# Patient Record
Sex: Female | Born: 1955 | Race: White | Hispanic: No | Marital: Married | Smoking: Current every day smoker
Health system: Southern US, Community
[De-identification: ages and names within clinical notes are randomized; demographics above are authoritative.]

## PROBLEM LIST (undated history)

## (undated) DIAGNOSIS — J449 Chronic obstructive pulmonary disease, unspecified: Secondary | ICD-10-CM

## (undated) DIAGNOSIS — G8929 Other chronic pain: Secondary | ICD-10-CM

## (undated) DIAGNOSIS — F329 Major depressive disorder, single episode, unspecified: Secondary | ICD-10-CM

## (undated) DIAGNOSIS — F419 Anxiety disorder, unspecified: Secondary | ICD-10-CM

## (undated) DIAGNOSIS — F32A Depression, unspecified: Secondary | ICD-10-CM

## (undated) HISTORY — PX: BACK SURGERY: SHX140

## (undated) HISTORY — PX: ABDOMINAL HYSTERECTOMY: SHX81

---

## 2014-08-08 ENCOUNTER — Encounter (HOSPITAL_BASED_OUTPATIENT_CLINIC_OR_DEPARTMENT_OTHER): Payer: Self-pay | Admitting: Emergency Medicine

## 2014-08-08 ENCOUNTER — Inpatient Hospital Stay (HOSPITAL_BASED_OUTPATIENT_CLINIC_OR_DEPARTMENT_OTHER)
Admission: EM | Admit: 2014-08-08 | Discharge: 2014-08-15 | DRG: 871 | Disposition: A | Discharge status: Home / Self Care | Payer: Federal, State, Local not specified - PPO | Attending: Pulmonary Disease | Admitting: Pulmonary Disease

## 2014-08-08 ENCOUNTER — Emergency Department (HOSPITAL_BASED_OUTPATIENT_CLINIC_OR_DEPARTMENT_OTHER): Payer: Federal, State, Local not specified - PPO

## 2014-08-08 DIAGNOSIS — G8929 Other chronic pain: Secondary | ICD-10-CM | POA: Diagnosis present

## 2014-08-08 DIAGNOSIS — R0602 Shortness of breath: Secondary | ICD-10-CM | POA: Diagnosis present

## 2014-08-08 DIAGNOSIS — E871 Hypo-osmolality and hyponatremia: Secondary | ICD-10-CM | POA: Diagnosis present

## 2014-08-08 DIAGNOSIS — E860 Dehydration: Secondary | ICD-10-CM | POA: Diagnosis present

## 2014-08-08 DIAGNOSIS — R6521 Severe sepsis with septic shock: Secondary | ICD-10-CM | POA: Diagnosis present

## 2014-08-08 DIAGNOSIS — J449 Chronic obstructive pulmonary disease, unspecified: Secondary | ICD-10-CM | POA: Diagnosis present

## 2014-08-08 DIAGNOSIS — J441 Chronic obstructive pulmonary disease with (acute) exacerbation: Secondary | ICD-10-CM | POA: Diagnosis present

## 2014-08-08 DIAGNOSIS — J90 Pleural effusion, not elsewhere classified: Secondary | ICD-10-CM | POA: Diagnosis present

## 2014-08-08 DIAGNOSIS — F329 Major depressive disorder, single episode, unspecified: Secondary | ICD-10-CM | POA: Diagnosis present

## 2014-08-08 DIAGNOSIS — F29 Unspecified psychosis not due to a substance or known physiological condition: Secondary | ICD-10-CM | POA: Diagnosis present

## 2014-08-08 DIAGNOSIS — J189 Pneumonia, unspecified organism: Secondary | ICD-10-CM | POA: Diagnosis present

## 2014-08-08 DIAGNOSIS — F17210 Nicotine dependence, cigarettes, uncomplicated: Secondary | ICD-10-CM | POA: Diagnosis present

## 2014-08-08 DIAGNOSIS — F513 Sleepwalking [somnambulism]: Secondary | ICD-10-CM | POA: Diagnosis present

## 2014-08-08 DIAGNOSIS — Z9981 Dependence on supplemental oxygen: Secondary | ICD-10-CM | POA: Diagnosis not present

## 2014-08-08 DIAGNOSIS — R55 Syncope and collapse: Secondary | ICD-10-CM | POA: Diagnosis present

## 2014-08-08 DIAGNOSIS — A419 Sepsis, unspecified organism: Secondary | ICD-10-CM | POA: Diagnosis present

## 2014-08-08 DIAGNOSIS — E876 Hypokalemia: Secondary | ICD-10-CM | POA: Diagnosis present

## 2014-08-08 DIAGNOSIS — Z91040 Latex allergy status: Secondary | ICD-10-CM

## 2014-08-08 DIAGNOSIS — J9601 Acute respiratory failure with hypoxia: Secondary | ICD-10-CM | POA: Diagnosis present

## 2014-08-08 DIAGNOSIS — F410 Panic disorder [episodic paroxysmal anxiety] without agoraphobia: Secondary | ICD-10-CM | POA: Diagnosis present

## 2014-08-08 DIAGNOSIS — J810 Acute pulmonary edema: Secondary | ICD-10-CM | POA: Diagnosis not present

## 2014-08-08 DIAGNOSIS — Z9289 Personal history of other medical treatment: Secondary | ICD-10-CM

## 2014-08-08 DIAGNOSIS — J432 Centrilobular emphysema: Secondary | ICD-10-CM | POA: Diagnosis not present

## 2014-08-08 DIAGNOSIS — F419 Anxiety disorder, unspecified: Secondary | ICD-10-CM | POA: Diagnosis present

## 2014-08-08 DIAGNOSIS — J439 Emphysema, unspecified: Secondary | ICD-10-CM

## 2014-08-08 HISTORY — DX: Chronic obstructive pulmonary disease, unspecified: J44.9

## 2014-08-08 HISTORY — DX: Depression, unspecified: F32.A

## 2014-08-08 HISTORY — DX: Other chronic pain: G89.29

## 2014-08-08 HISTORY — DX: Anxiety disorder, unspecified: F41.9

## 2014-08-08 HISTORY — DX: Major depressive disorder, single episode, unspecified: F32.9

## 2014-08-08 LAB — COMPREHENSIVE METABOLIC PANEL
ALBUMIN: 2.6 g/dL — AB (ref 3.5–5.2)
ALT: 17 U/L (ref 0–35)
ALT: 16 U/L (ref 0–35)
AST: 32 U/L (ref 0–37)
AST: 32 U/L (ref 0–37)
Albumin: 3.3 g/dL — ABNORMAL LOW (ref 3.5–5.2)
Alkaline Phosphatase: 47 U/L (ref 39–117)
Alkaline Phosphatase: 40 U/L (ref 39–117)
Anion gap: 5 (ref 5–15)
Anion gap: 7 (ref 5–15)
BILIRUBIN TOTAL: 0.9 mg/dL (ref 0.3–1.2)
BUN: 13 mg/dL (ref 6–23)
BUN: 10 mg/dL (ref 6–23)
CALCIUM: 8.1 mg/dL — AB (ref 8.4–10.5)
CHLORIDE: 95 mmol/L — AB (ref 96–112)
CO2: 24 mmol/L (ref 19–32)
CO2: 22 mmol/L (ref 19–32)
CREATININE: 1 mg/dL (ref 0.50–1.10)
Calcium: 7.6 mg/dL — ABNORMAL LOW (ref 8.4–10.5)
Chloride: 101 mmol/L (ref 96–112)
Creatinine, Ser: 0.85 mg/dL (ref 0.50–1.10)
GFR calc Af Amer: 72 mL/min — ABNORMAL LOW (ref >90)
GFR calc Af Amer: 87 mL/min — ABNORMAL LOW (ref >90)
GFR calc non Af Amer: 62 mL/min — ABNORMAL LOW (ref >90)
GFR calc non Af Amer: 75 mL/min — ABNORMAL LOW (ref >90)
Glucose, Bld: 102 mg/dL — ABNORMAL HIGH (ref 70–99)
Glucose, Bld: 117 mg/dL — ABNORMAL HIGH (ref 70–99)
Potassium: 3.1 mmol/L — ABNORMAL LOW (ref 3.5–5.1)
Potassium: 3 mmol/L — ABNORMAL LOW (ref 3.5–5.1)
SODIUM: 130 mmol/L — AB (ref 135–145)
Sodium: 124 mmol/L — ABNORMAL LOW (ref 135–145)
Total Bilirubin: 1 mg/dL (ref 0.3–1.2)
Total Protein: 6.5 g/dL (ref 6.0–8.3)
Total Protein: 5.5 g/dL — ABNORMAL LOW (ref 6.0–8.3)

## 2014-08-08 LAB — URINALYSIS, ROUTINE W REFLEX MICROSCOPIC
BILIRUBIN URINE: NEGATIVE
GLUCOSE, UA: NEGATIVE mg/dL
HGB URINE DIPSTICK: NEGATIVE
KETONES UR: NEGATIVE mg/dL
LEUKOCYTES UA: NEGATIVE
Nitrite: NEGATIVE
PH: 6 (ref 5.0–8.0)
PROTEIN: NEGATIVE mg/dL
Specific Gravity, Urine: 1.027 (ref 1.005–1.030)
Urobilinogen, UA: 0.2 mg/dL (ref 0.0–1.0)

## 2014-08-08 LAB — TROPONIN I

## 2014-08-08 LAB — CBC
HCT: 39.4 % (ref 36.0–46.0)
Hemoglobin: 13.7 g/dL (ref 12.0–15.0)
MCH: 32.9 pg (ref 26.0–34.0)
MCHC: 34.8 g/dL (ref 30.0–36.0)
MCV: 94.5 fL (ref 78.0–100.0)
Platelets: 221 10*3/uL (ref 150–400)
RBC: 4.17 MIL/uL (ref 3.87–5.11)
RDW: 13.9 % (ref 11.5–15.5)
WBC: 10.9 10*3/uL — ABNORMAL HIGH (ref 4.0–10.5)

## 2014-08-08 LAB — CBC WITH DIFFERENTIAL/PLATELET
BASOS ABS: 0 10*3/uL (ref 0.0–0.1)
BASOS PCT: 0 % (ref 0–1)
Eosinophils Absolute: 0 10*3/uL (ref 0.0–0.7)
Eosinophils Relative: 0 % (ref 0–5)
HCT: 41.2 % (ref 36.0–46.0)
Hemoglobin: 14.7 g/dL (ref 12.0–15.0)
LYMPHS PCT: 9 % — AB (ref 12–46)
Lymphs Abs: 1 10*3/uL (ref 0.7–4.0)
MCH: 33.2 pg (ref 26.0–34.0)
MCHC: 35.7 g/dL (ref 30.0–36.0)
MCV: 93 fL (ref 78.0–100.0)
Monocytes Absolute: 0.3 10*3/uL (ref 0.1–1.0)
Monocytes Relative: 2 % — ABNORMAL LOW (ref 3–12)
NEUTROS ABS: 9.7 10*3/uL — AB (ref 1.7–7.7)
NEUTROS PCT: 89 % — AB (ref 43–77)
PLATELETS: 265 10*3/uL (ref 150–400)
RBC: 4.43 MIL/uL (ref 3.87–5.11)
RDW: 13.6 % (ref 11.5–15.5)
WBC: 11 10*3/uL — AB (ref 4.0–10.5)

## 2014-08-08 LAB — I-STAT VENOUS BLOOD GAS, ED
Acid-base deficit: 7 mmol/L — ABNORMAL HIGH (ref 0.0–2.0)
Bicarbonate: 16.4 mEq/L — ABNORMAL LOW (ref 20.0–24.0)
O2 Saturation: 91 %
PH VEN: 7.383 — AB (ref 7.250–7.300)
Patient temperature: 98
TCO2: 17 mmol/L (ref 0–100)
pCO2, Ven: 27.4 mmHg — ABNORMAL LOW (ref 45.0–50.0)
pO2, Ven: 60 mmHg — ABNORMAL HIGH (ref 30.0–45.0)

## 2014-08-08 LAB — GLUCOSE, CAPILLARY: GLUCOSE-CAPILLARY: 104 mg/dL — AB (ref 70–99)

## 2014-08-08 LAB — PROTIME-INR
INR: 1.26 (ref 0.00–1.49)
Prothrombin Time: 15.8 seconds — ABNORMAL HIGH (ref 11.6–15.2)

## 2014-08-08 LAB — LACTIC ACID, PLASMA
LACTIC ACID, VENOUS: 2.5 mmol/L — AB (ref 0.5–2.0)
Lactic Acid, Venous: 2.7 mmol/L (ref 0.5–2.0)

## 2014-08-08 LAB — MAGNESIUM: MAGNESIUM: 1.3 mg/dL — AB (ref 1.5–2.5)

## 2014-08-08 LAB — MRSA PCR SCREENING: MRSA BY PCR: NEGATIVE

## 2014-08-08 LAB — PROCALCITONIN: Procalcitonin: 7.55 ng/mL

## 2014-08-08 LAB — I-STAT CG4 LACTIC ACID, ED: LACTIC ACID, VENOUS: 2.89 mmol/L — AB (ref 0.5–2.0)

## 2014-08-08 LAB — PHOSPHORUS: Phosphorus: 2.2 mg/dL — ABNORMAL LOW (ref 2.3–4.6)

## 2014-08-08 MED ORDER — CEFTRIAXONE SODIUM IN DEXTROSE 20 MG/ML IV SOLN
1.0000 g | INTRAVENOUS | Status: DC
Start: 2014-08-08 — End: 2014-08-08
  Administered 2014-08-08: 1 g via INTRAVENOUS
  Filled 2014-08-08: qty 50

## 2014-08-08 MED ORDER — SODIUM CHLORIDE 0.9 % IV SOLN
Freq: Once | INTRAVENOUS | Status: AC
  Administered 2014-08-08: 15:00:00 via INTRAVENOUS

## 2014-08-08 MED ORDER — ROCURONIUM BROMIDE 50 MG/5ML IV SOLN
INTRAVENOUS | Status: AC
  Filled 2014-08-08: qty 2

## 2014-08-08 MED ORDER — SODIUM CHLORIDE 0.9 % IV BOLUS (SEPSIS): 1000.0000 mL | Freq: Once | INTRAVENOUS | Status: DC

## 2014-08-08 MED ORDER — SODIUM CHLORIDE 0.9 % IV SOLN: 250.0000 mL | INTRAVENOUS | Status: DC | PRN

## 2014-08-08 MED ORDER — CETYLPYRIDINIUM CHLORIDE 0.05 % MT LIQD
7.0000 mL | Freq: Two times a day (BID) | OROMUCOSAL | Status: DC
  Administered 2014-08-09 – 2014-08-15 (×13): 7 mL via OROMUCOSAL

## 2014-08-08 MED ORDER — SODIUM CHLORIDE 0.9 % IV SOLN
INTRAVENOUS | Status: DC
Start: 2014-08-08 — End: 2014-08-12
  Administered 2014-08-08 – 2014-08-12 (×3): via INTRAVENOUS

## 2014-08-08 MED ORDER — VANCOMYCIN HCL IN DEXTROSE 1-5 GM/200ML-% IV SOLN
1000.0000 mg | Freq: Once | INTRAVENOUS | Status: AC
Start: 2014-08-08 — End: 2014-08-08
  Administered 2014-08-08: 1000 mg via INTRAVENOUS
  Filled 2014-08-08: qty 200

## 2014-08-08 MED ORDER — CHLORHEXIDINE GLUCONATE 0.12 % MT SOLN
15.0000 mL | Freq: Two times a day (BID) | OROMUCOSAL | Status: DC
  Administered 2014-08-08 – 2014-08-15 (×14): 15 mL via OROMUCOSAL
  Filled 2014-08-08 (×15): qty 15

## 2014-08-08 MED ORDER — POTASSIUM CHLORIDE 10 MEQ/100ML IV SOLN
10.0000 meq | INTRAVENOUS | Status: AC
  Administered 2014-08-08 (×4): 10 meq via INTRAVENOUS
  Filled 2014-08-08: qty 100

## 2014-08-08 MED ORDER — BUDESONIDE 0.25 MG/2ML IN SUSP
0.2500 mg | Freq: Two times a day (BID) | RESPIRATORY_TRACT | Status: DC
  Administered 2014-08-08 – 2014-08-15 (×14): 0.25 mg via RESPIRATORY_TRACT
  Filled 2014-08-08 (×17): qty 2

## 2014-08-08 MED ORDER — IPRATROPIUM-ALBUTEROL 0.5-2.5 (3) MG/3ML IN SOLN
3.0000 mL | RESPIRATORY_TRACT | Status: DC
  Administered 2014-08-08: 3 mL via RESPIRATORY_TRACT
  Filled 2014-08-08: qty 3

## 2014-08-08 MED ORDER — SUCCINYLCHOLINE CHLORIDE 20 MG/ML IJ SOLN
INTRAMUSCULAR | Status: AC
  Filled 2014-08-08: qty 1

## 2014-08-08 MED ORDER — LIDOCAINE HCL (CARDIAC) 20 MG/ML IV SOLN
INTRAVENOUS | Status: AC
  Filled 2014-08-08: qty 5

## 2014-08-08 MED ORDER — AZITHROMYCIN 500 MG IV SOLR
500.0000 mg | INTRAVENOUS | Status: DC
  Administered 2014-08-08 – 2014-08-09 (×2): 500 mg via INTRAVENOUS
  Filled 2014-08-08 (×3): qty 500

## 2014-08-08 MED ORDER — IOHEXOL 350 MG/ML SOLN
100.0000 mL | Freq: Once | INTRAVENOUS | Status: AC | PRN
  Administered 2014-08-08: 100 mL via INTRAVENOUS

## 2014-08-08 MED ORDER — SODIUM CHLORIDE 0.9 % IV BOLUS (SEPSIS)
2000.0000 mL | Freq: Once | INTRAVENOUS | Status: AC
Start: 2014-08-08 — End: 2014-08-08
  Administered 2014-08-08: 2000 mL via INTRAVENOUS

## 2014-08-08 MED ORDER — NOREPINEPHRINE BITARTRATE 1 MG/ML IV SOLN
2.0000 ug/min | INTRAVENOUS | Status: DC
  Administered 2014-08-08 – 2014-08-09 (×2): 2 ug/min via INTRAVENOUS
  Filled 2014-08-08 (×2): qty 4

## 2014-08-08 MED ORDER — PANTOPRAZOLE SODIUM 40 MG IV SOLR
40.0000 mg | Freq: Every day | INTRAVENOUS | Status: DC
  Administered 2014-08-08: 40 mg via INTRAVENOUS
  Filled 2014-08-08 (×2): qty 40

## 2014-08-08 MED ORDER — CEFTRIAXONE SODIUM IN DEXTROSE 20 MG/ML IV SOLN
1.0000 g | Freq: Once | INTRAVENOUS | Status: AC
  Administered 2014-08-08: 1 g via INTRAVENOUS
  Filled 2014-08-08: qty 50

## 2014-08-08 MED ORDER — ALBUTEROL SULFATE (2.5 MG/3ML) 0.083% IN NEBU: 2.5000 mg | INHALATION_SOLUTION | RESPIRATORY_TRACT | Status: DC | PRN

## 2014-08-08 MED ORDER — CEFTRIAXONE SODIUM IN DEXTROSE 40 MG/ML IV SOLN
2.0000 g | INTRAVENOUS | Status: DC
  Administered 2014-08-09 – 2014-08-14 (×6): 2 g via INTRAVENOUS
  Filled 2014-08-08 (×7): qty 50

## 2014-08-08 MED ORDER — FENTANYL CITRATE 0.05 MG/ML IJ SOLN
12.5000 ug | INTRAMUSCULAR | Status: DC | PRN
  Administered 2014-08-08 – 2014-08-12 (×13): 25 ug via INTRAVENOUS
  Administered 2014-08-12: 12.5 ug via INTRAVENOUS
  Administered 2014-08-13 – 2014-08-14 (×2): 25 ug via INTRAVENOUS
  Filled 2014-08-08 (×16): qty 2

## 2014-08-08 MED ORDER — ARFORMOTEROL TARTRATE 15 MCG/2ML IN NEBU
15.0000 ug | INHALATION_SOLUTION | Freq: Two times a day (BID) | RESPIRATORY_TRACT | Status: DC
  Administered 2014-08-08 – 2014-08-14 (×13): 15 ug via RESPIRATORY_TRACT
  Filled 2014-08-08 (×17): qty 2

## 2014-08-08 MED ORDER — SODIUM CHLORIDE 0.9 % IV BOLUS (SEPSIS)
1000.0000 mL | Freq: Once | INTRAVENOUS | Status: AC
Start: 2014-08-08 — End: 2014-08-08
  Administered 2014-08-08: 1000 mL via INTRAVENOUS

## 2014-08-08 MED ORDER — HEPARIN SODIUM (PORCINE) 5000 UNIT/ML IJ SOLN
5000.0000 [IU] | Freq: Three times a day (TID) | INTRAMUSCULAR | Status: DC
  Administered 2014-08-08 – 2014-08-14 (×18): 5000 [IU] via SUBCUTANEOUS
  Filled 2014-08-08 (×24): qty 1

## 2014-08-08 MED ORDER — PIPERACILLIN-TAZOBACTAM 3.375 G IVPB 30 MIN
3.3750 g | Freq: Once | INTRAVENOUS | Status: AC
  Administered 2014-08-08: 3.375 g via INTRAVENOUS
  Filled 2014-08-08 (×2): qty 50

## 2014-08-08 MED ORDER — PNEUMOCOCCAL VAC POLYVALENT 25 MCG/0.5ML IJ INJ
0.5000 mL | INJECTION | INTRAMUSCULAR | Status: AC
  Administered 2014-08-09: 0.5 mL via INTRAMUSCULAR
  Filled 2014-08-08: qty 0.5

## 2014-08-08 MED ORDER — ETOMIDATE 2 MG/ML IV SOLN
INTRAVENOUS | Status: AC
  Filled 2014-08-08: qty 20

## 2014-08-08 NOTE — ED Provider Notes (Signed)
CSN: [id number]     Arrival date & time 08/08/14  1302 History   First MD Initiated Contact with Patient 08/08/14 1336     Chief Complaint  Patient presents with  . Shortness of Breath     (Consider location/radiation/quality/duration/timing/severity/associated sxs/prior Treatment) The history is provided by the patient.  Mieke Brinley is a 59 y.o. female hx of COPD  Here presenting with shortness of breath, syncope.  She has been having productive cough for several days.  Also had 2 episodes diarrhea and occasional vomiting.  She was supposed to go for a walk today with her family and walk a short distance and then passed out.  She states that she has some worsening shortness of breath prior to it.  Patient was noted to be hypoxic to 82% on room air  And hypotensive 80s on arrival with tachycardia 114.  She denies any history of PE.    Past Medical History  Diagnosis Date  . COPD (chronic obstructive pulmonary disease)    Past Surgical History  Procedure Laterality Date  . Back surgery    . Abdominal hysterectomy     No family history on file. History  Substance Use Topics  . Smoking status: Current Every Day Smoker  . Smokeless tobacco: Not on file  . Alcohol Use: No   OB History    No data available     Review of Systems  Respiratory: Positive for shortness of breath.   All other systems reviewed and are negative.     Allergies  Contrast media  Home Medications   Prior to Admission medications   Not on File   BP 101/44 mmHg  Pulse 119  Temp(Src) 98.2 F (36.8 C) (Oral)  Resp 23  Ht  (1.626 m)  Wt 160 lb (72.576 kg)  BMI 27.45 kg/m2  SpO2 100% Physical Exam  Constitutional: She is oriented to person, place, and time.  Ill appearing   HENT:  Head: Normocephalic.  MM dry   Eyes: Conjunctivae are normal. Pupils are equal, round, and reactive to light.  Neck: Normal range of motion. Neck supple.  Cardiovascular: Regular rhythm and normal heart sounds.    Tachycardic   Pulmonary/Chest:  Crackles bilateral bases   Abdominal: Soft. Bowel sounds are normal. She exhibits no distension. There is no tenderness. There is no rebound.  Musculoskeletal: Normal range of motion. She exhibits no edema or tenderness.  Neurological: She is alert and oriented to person, place, and time. No cranial nerve deficit. Coordination normal.  Skin: Skin is warm and dry.  Psychiatric: She has a normal mood and affect. Her behavior is normal. Judgment and thought content normal.  Nursing note and vitals reviewed.   ED Course  Procedures (including critical care time)  CRITICAL CARE Performed by: Silverio Lay, Simi Briel   Total critical care time: 30 min   Critical care time was exclusive of separately billable procedures and treating other patients.  Critical care was necessary to treat or prevent imminent or life-threatening deterioration.  Critical care was time spent personally by me on the following activities: development of treatment plan with patient and/or surrogate as well as nursing, discussions with consultants, evaluation of patient's response to treatment, examination of patient, obtaining history from patient or surrogate, ordering and performing treatments and interventions, ordering and review of laboratory studies, ordering and review of radiographic studies, pulse oximetry and re-evaluation of patient's condition.   Angiocath insertion Performed by: Chaney Malling  Consent: Verbal consent obtained. Risks and  benefits: risks, benefits and alternatives were discussed Time out: Immediately prior to procedure a "time out" was called to verify the correct patient, procedure, equipment, support staff and site/side marked as required.  Preparation: Patient was prepped and draped in the usual sterile fashion.  Vein Location: L brachial  Ultrasound Guided  Gauge: 20 long  Normal blood return and flush without difficulty Patient tolerance: Patient tolerated  the procedure well with no immediate complications.     Labs Review Labs Reviewed  CBC WITH DIFFERENTIAL/PLATELET - Abnormal; Notable for the following:    WBC 11.0 (*)    Neutrophils Relative % 89 (*)    Neutro Abs 9.7 (*)    Lymphocytes Relative 9 (*)    Monocytes Relative 2 (*)    All other components within normal limits  I-STAT CG4 LACTIC ACID, ED - Abnormal; Notable for the following:    Lactic Acid, Venous 2.89 (*)    All other components within normal limits  CULTURE, BLOOD (ROUTINE X 2)  CULTURE, BLOOD (ROUTINE X 2)  COMPREHENSIVE METABOLIC PANEL  TROPONIN I  PROTIME-INR    Imaging Review Ct Angio Chest Pe W/cm &/or Wo Cm  08/08/2014   CLINICAL DATA:  Patient passed out today. Syncopal episode today. Short of breath. Hypotension. History of COPD.  EXAM: CT ANGIOGRAPHY CHEST WITH CONTRAST  TECHNIQUE: Multidetector CT imaging of the chest was performed using the standard protocol during bolus administration of intravenous contrast. Multiplanar CT image reconstructions and MIPs were obtained to evaluate the vascular anatomy.  CONTRAST:  OMNIPAQUE IOHEXOL 350 MG/ML SOLN  COMPARISON:  None.  FINDINGS: Bones: Thoracic vertebral body height is preserved. No aggressive osseous lesions. No displaced rib fractures.  Cardiovascular: Technically adequate study for evaluation of pulmonary embolus. Negative for pulmonary embolism. Coronary artery atherosclerosis is present. If office based assessment of coronary risk factors has not been performed, it is now recommended. Motion artifact is present, mildly degrading the exam. Aortic arch atherosclerosis.  Lungs: There is consolidation of most of the LEFT lower lobe with a dependent distribution. A small area of consolidation in the RIGHT lower lobe is also present. There is underlying emphysema. Upper lobe faint interstitial and airspace opacity.  Central airways: Patent.  No bronchial air-fluid levels.  Effusions: None.  Lymphadenopathy: No  axillary adenopathy. Small peritracheal an AP window lymph nodes are probably reactive.  Esophagus: Patulous.  Small hiatal hernia.  Upper abdomen: Within normal limits.  Other: None.  Review of the MIP images confirms the above findings.  IMPRESSION: 1. Negative for pulmonary embolism or acute aortic abnormality. 2. Atherosclerosis and coronary artery disease. 3. Consolidation of most of the LEFT lower lobe with smaller area of consolidation in the dependent RIGHT lower lobe. The pattern is most compatible with aspiration. 4. Emphysema, with interstitial and airspace opacity in the upper lobes. This may represent aspiration or superimposed pulmonary edema.   Electronically Signed   By: Andreas Newport M.D.   On: 08/08/2014 14:22     EKG Interpretation   Date/Time:  Tuesday August 08 2014 13:34:02 EST Ventricular Rate:  117 PR Interval:  138 QRS Duration: 90 QT Interval:  314 QTC Calculation: 438 R Axis:   26 Text Interpretation:  Sinus tachycardia Nonspecific ST abnormality  Abnormal ECG No previous ECGs available Confirmed by Ova Meegan  MD, Melina Mosteller  ([id number]) on 08/08/2014 1:35:52 PM      MDM   Final diagnoses:  Shortness of breath   Patriciann Becht is a 59 y.o. female  here with shortness of breath, hypotension, hypoxia. Concerned for massive PE. Will get CT angio to r/o PE. Will get labs.   3 PM CT showed no PE but has multi focal pneumonia. After 3 L NS, BP still in mid 90s. Given vanc/zosyn. I started her on levophed drip. She is 100% on nonrebreather and mentating well so will not intubate currently. Discussed with Dr. Darrol AngelSimons from critical care and will transfer.     Richardean Canalavid H Deniro Laymon, MD 08/08/14 [phone]

## 2014-08-08 NOTE — ED Notes (Signed)
Attempted to wean patient to 40% VM, did not tolerate. Placed back on PRB. RT will continue to monitor.

## 2014-08-08 NOTE — ED Notes (Signed)
Report called to Paula with [facility]. 

## 2014-08-08 NOTE — Progress Notes (Signed)
Pt arrived on the unit and placed on ICU equipment.  Pt on 100% nonrebreather sat 88-92%.  Pt with labored breathing and a non-productive cough.  Pt on levophed gtt at 8915mcg/mn.  Provider at bedside.  See full assessment.

## 2014-08-08 NOTE — ED Notes (Signed)
Productive cough with SOB. Passed out at hotel today. Pt feels weak and 02sats are low at this time on room air.

## 2014-08-08 NOTE — Progress Notes (Addendum)
ANTIBIOTIC CONSULT NOTE - INITIAL  Pharmacy Consult for ceftriaxone and azithromycin Indication: CAP  Allergies  Allergen Reactions  . Contrast Media [Iodinated Diagnostic Agents] Nausea And Vomiting    Patient Measurements: Height: 5\' 4"  (162.6 cm) Weight: 160 lb (72.576 kg) IBW/kg (Calculated) : 54.7   Vital Signs: Temp: 98 F (36.7 C) (03/08 1655) Temp Source: Oral (03/08 1320) BP: 106/47 mmHg (03/08 1715) Pulse Rate: 116 (03/08 1715) Intake/Output from previous day:   Intake/Output from this shift: Total I/O In: -  Out: 800 [Urine:800]  Labs:  Recent Labs  08/08/14 1400  WBC 11.0*  HGB 14.7  PLT 265  CREATININE 1.00   Estimated Creatinine Clearance: 61.4 mL/min (by C-G formula based on Cr of 1). No results for input(s): VANCOTROUGH, VANCOPEAK, VANCORANDOM, GENTTROUGH, GENTPEAK, GENTRANDOM, TOBRATROUGH, TOBRAPEAK, TOBRARND, AMIKACINPEAK, AMIKACINTROU, AMIKACIN in the last 72 hours.   Microbiology: No results found for this or any previous visit (from the past 720 hour(s)).  Medical History: Past Medical History  Diagnosis Date  . COPD (chronic obstructive pulmonary disease)     Assessment: [age] YOF admitted with SOB with history of COPD and productive cough for several days. Had a syncopal episode prior to arrival. CT negative for PE, but did show multifocal pneumonia. She was given a 1 time dose of Zosyn 3.375g and 1 dose of vancomycin of 1000mg  in the ED. To start ceftriaxone and azithromycin for CAP. SCr 1, est CrCl ~60-8165mL/min  Goal of Therapy:  eradication of infection  Plan:  -ceftriaxone 1g IV q24h -azithromycin 500mg  IV q24h -pharmacy will sign off from these consults as no renal adjustment is required. Standard CCM consult in place, so we will follow for clinical progression, c/s, LOT  Martiza Speth D. Mersadies Petree, PharmD, BCPS Clinical Pharmacist Pager: [phone] 08/08/2014 7:05 PM

## 2014-08-08 NOTE — ED Notes (Signed)
[facility] here at this time 

## 2014-08-08 NOTE — ED Notes (Signed)
Pt moved to room 14 at this time. When pt came back to room 1, pt had oxygen sats at 87, BP 76/32. IVF bolus started

## 2014-08-08 NOTE — ED Notes (Signed)
Called report to L'Tya,RN

## 2014-08-08 NOTE — H&P (Signed)
PULMONARY / CRITICAL CARE MEDICINE   Name: Destiny Chapman MRN: [id number] DOB: 1955/07/29    ADMISSION DATE:  08/08/2014 CONSULTATION DATE:  3/8  REFERRING MD :  Silverio Lay   CHIEF COMPLAINT:  Acute hypoxic respiratory failure   INITIAL PRESENTATION:  59 year old female admitted from [facility] on 3/8 w/ working dx of sepsis and acute hypoxic respiratory failure in the setting of LLL CAP.    STUDIES:  CT chest 3/8: 1. Negative for pulmonary embolism. Consolidation of most of the LEFT lower lobe with smaller area of consolidation in the dependent RIGHT lower lobe. Emphysema, with interstitial and airspace opacity in the upper lobes. This may represent aspiration or superimposed pulmonary edema.  SIGNIFICANT EVENTS:   HISTORY OF PRESENT ILLNESS:   59 y.o. Female, current 1.5 ppd smoker,  hx of COPD Here presented to [facility] on 3/8 w/ several day h/o productive cough and progressive shortness of breath. Syncope.Also had 2 episodes diarrhea and occasional vomiting.She had planned to go for a walk with her family on the day of admit. During the walk she became progressively more SOB.  She and walked a short distance and then passed out. She presented to [facility] w/ these symptoms. Patient was noted to be hypoxic to 82% on room air And hypotensive 80s on arrival with tachycardia 114. She denied any history of PE.  CT chest showed marked LLL consolidation. She was placed on oxygen, IVFs started and ABX administered. She was transferred to [facility] for admission   PAST MEDICAL HISTORY :   has a past medical history of COPD (chronic obstructive pulmonary disease).  has past surgical history that includes Back surgery and Abdominal hysterectomy. Prior to Admission medications   Not on File   Allergies  Allergen Reactions  . Contrast Media [Iodinated Diagnostic Agents] Nausea And Vomiting    FAMILY HISTORY:  has no family status information on file.  SOCIAL HISTORY:  reports that she has been smoking.  She  does not have any smokeless tobacco history on file. She reports that she does not drink alcohol.  REVIEW OF SYSTEMS:   Bolds are positive  Constitutional: weight loss, gain, night sweats, Fevers, chills, fatigue .  HEENT: headaches, Sore throat, sneezing, nasal congestion, post nasal drip, Difficulty swallowing, Tooth/dental problems, visual complaints visual changes, ear ache CV:  chest pain, radiates: ,Orthopnea, PND, swelling in lower extremities, dizziness, palpitations, syncope.  GI  heartburn, indigestion, abdominal pain, nausea, vomiting, diarrhea, change in bowel habits, loss of appetite, bloody stools.  Resp: cough, non-productive: , hemoptysis, dyspnea, chest pain, pleuritic.  Skin: rash or itching or icterus GU: dysuria, change in color of urine, urgency or frequency. flank pain, hematuria  MS: joint pain or swelling. decreased range of motion  Psych: change in mood or affect. depression or anxiety.  Neuro: difficulty with speech, weakness, numbness, ataxia    SUBJECTIVE:   VITAL SIGNS: Temp:  [98.2 F (36.8 C)] 98.2 F (36.8 C) (03/08 1320) Pulse Rate:  [114-121] 116 (03/08 1520) Resp:  [18-34] 25 (03/08 1520) BP: (83-106)/(37-49) 106/49 mmHg (03/08 1520) SpO2:  [82 %-100 %] 100 % (03/08 1520) Weight:  [72.576 kg (160 lb)] 72.576 kg (160 lb) (03/08 1320) HEMODYNAMICS:   VENTILATOR SETTINGS:   INTAKE / OUTPUT: No intake or output data in the 24 hours ending 08/08/14 1531  PHYSICAL EXAMINATION: General:  Chronically ill appearing female in mild distress Neuro:  Alert, oriented, non-focal HEENT:  [place]/AT, PERRL, no JVD noted Cardiovascular:  RRR, no MRG Lungs:  Bilateral rhonchi in bases L>R. Overall diminished. Abdomen:  Soft, non-tender, non-distended Musculoskeletal:  No acute deformity or ROM limitation Skin:  Grossly intact.  LABS:  CBC  Recent Labs Lab 08/08/14 1400  WBC 11.0*  HGB 14.7  HCT 41.2  PLT 265   Coag's  Recent Labs Lab  08/08/14 1400  INR 1.26   BMET  Recent Labs Lab 08/08/14 1400  NA 124*  K 3.1*  CL 95*  CO2 24  BUN 13  CREATININE 1.00  GLUCOSE 102*   Electrolytes  Recent Labs Lab 08/08/14 1400  CALCIUM 8.1*   Sepsis Markers  Recent Labs Lab 08/08/14 1445  LATICACIDVEN 2.89*   ABG No results for input(s): PHART, PCO2ART, PO2ART in the last 168 hours. Liver Enzymes  Recent Labs Lab 08/08/14 1400  AST 32  ALT 17  ALKPHOS 47  BILITOT 0.9  ALBUMIN 3.3*   Cardiac Enzymes  Recent Labs Lab 08/08/14 1400  TROPONINI <0.03   Glucose No results for input(s): GLUCAP in the last 168 hours.  Imaging No results found.   ASSESSMENT / PLAN:  PULMONARY OETT A: Acute hypoxic respiratory failure  LLL consolidative airspace disease c/w PNA w/ bilateral pulmonary infiltrates  H/o COPD  P:   Supplemental oxygen to keep SPo2 greater than 92% Pulm hygiene  Scheduled brovana/budesonide  May require intubation if any decline See ID section   CARDIOVASCULAR CVL A:  Sepsis/septic shock Syncopal episode  P:  Ensure 30 ml/kg fluid challenge  Continue norepinephrine to maintain MAP > 65 mm/Hg Attempt to wean norepi, if unable, place CVL > CVP monitoring goal 8-12. Repeat lactic acid Cycle troponin  RENAL A:   Hyponatremia - suspect this resolved with volume resusctiation Hypokalemia   P:   STAT CMP Replace K Free water restrict NS @ 50/hr until chem results  GASTROINTESTINAL A:   Nausea and vomiting   P:   NPO given risk for intubation  PPI for SUP  HEMATOLOGIC A:   No acute issues  P:  Heparin for DVT prophylaxis  Trend CBC Transfuse per ICU protocol   INFECTIOUS A:   CAP  P:   BCx2 3/8>>> Sputum 3/8>>> Urine strep antigen 3/8>> Urine legionella antigen 3/8>> Respiratory viral panel 3/8 >>> Rocephin 3/8>>> Azithro 3/8>>>  ENDOCRINE A:   No acute issues P:   Trend glucose   NEUROLOGIC A:   No acute issues P:   RASS goal:  0 Monitor   FAMILY  - Updates: Updated patient at bedside 3/8  - Inter-disciplinary family meet or Palliative Care meeting due by:  08/15/2014   Joneen RoachPaul Hoffman, AGACNP-BC [place] [facility] Pager [phone] or [phone]  Attending:  I have seen and examined the patient with nurse practitioner/resident and agree with the note above.   Ms. Destiny Chapman has severe community acquired pneumonia with septic shock  She is currently tachypneic but speaking comfortably without respiratory distress Crackles left lung, no wheezing  Images from the CT chest reveiwed  Septic shock> She is on levophed> will attempt to wean with further IVF, watch serial lactic acid Severe CAP> increase ceftriaxone to 2g, continue azithro COPD> not in exacerbation, continue bronchodilators Maintain in ICU, high intubation risk  My cc time 45 minutes  Heber CarolinaBrent Jenel Gierke, MD [facility] Pager: [phone] Cell: [phone] If no response, call [phone]

## 2014-08-08 NOTE — ED Notes (Signed)
[facility] has left with the patient at this time

## 2014-08-09 ENCOUNTER — Inpatient Hospital Stay (HOSPITAL_COMMUNITY): Payer: Federal, State, Local not specified - PPO

## 2014-08-09 LAB — TROPONIN I: TROPONIN I: 0.03 ng/mL (ref <0.031)

## 2014-08-09 LAB — BASIC METABOLIC PANEL
Anion gap: 9 (ref 5–15)
BUN: 9 mg/dL (ref 6–23)
CALCIUM: 7.5 mg/dL — AB (ref 8.4–10.5)
CHLORIDE: 103 mmol/L (ref 96–112)
CO2: 20 mmol/L (ref 19–32)
CREATININE: 0.67 mg/dL (ref 0.50–1.10)
GFR calc non Af Amer: >90 mL/min (ref >90)
Glucose, Bld: 96 mg/dL (ref 70–99)
Potassium: 3.2 mmol/L — ABNORMAL LOW (ref 3.5–5.1)
Sodium: 132 mmol/L — ABNORMAL LOW (ref 135–145)

## 2014-08-09 LAB — CBC
HCT: 34 % — ABNORMAL LOW (ref 36.0–46.0)
HEMOGLOBIN: 11.7 g/dL — AB (ref 12.0–15.0)
MCH: 32.1 pg (ref 26.0–34.0)
MCHC: 34.4 g/dL (ref 30.0–36.0)
MCV: 93.2 fL (ref 78.0–100.0)
Platelets: 194 10*3/uL (ref 150–400)
RBC: 3.65 MIL/uL — ABNORMAL LOW (ref 3.87–5.11)
RDW: 13.9 % (ref 11.5–15.5)
WBC: 11.4 10*3/uL — AB (ref 4.0–10.5)

## 2014-08-09 LAB — LEGIONELLA ANTIGEN, URINE

## 2014-08-09 LAB — STREP PNEUMONIAE URINARY ANTIGEN: STREP PNEUMO URINARY ANTIGEN: NEGATIVE

## 2014-08-09 MED ORDER — ACETAMINOPHEN 325 MG PO TABS: 650.0000 mg | ORAL_TABLET | Freq: Four times a day (QID) | ORAL | Status: DC | PRN

## 2014-08-09 MED ORDER — ACETAMINOPHEN 650 MG RE SUPP
650.0000 mg | Freq: Four times a day (QID) | RECTAL | Status: DC | PRN
  Administered 2014-08-09: 650 mg via RECTAL
  Filled 2014-08-09: qty 1

## 2014-08-09 MED ORDER — ALPRAZOLAM 0.25 MG PO TABS
0.2500 mg | ORAL_TABLET | Freq: Three times a day (TID) | ORAL | Status: DC | PRN
  Administered 2014-08-09 – 2014-08-11 (×5): 0.25 mg via ORAL
  Filled 2014-08-09 (×5): qty 1

## 2014-08-09 MED ORDER — SODIUM CHLORIDE 0.9 % IV BOLUS (SEPSIS)
500.0000 mL | Freq: Once | INTRAVENOUS | Status: AC
  Administered 2014-08-09: 500 mL via INTRAVENOUS

## 2014-08-09 MED ORDER — PANTOPRAZOLE SODIUM 40 MG PO TBEC
40.0000 mg | DELAYED_RELEASE_TABLET | Freq: Every day | ORAL | Status: DC
  Administered 2014-08-09 – 2014-08-12 (×4): 40 mg via ORAL
  Filled 2014-08-09 (×4): qty 1

## 2014-08-09 MED ORDER — POTASSIUM CHLORIDE CRYS ER 20 MEQ PO TBCR
30.0000 meq | EXTENDED_RELEASE_TABLET | ORAL | Status: AC
  Administered 2014-08-09 (×2): 30 meq via ORAL
  Filled 2014-08-09 (×2): qty 1

## 2014-08-09 MED ORDER — ARIPIPRAZOLE 10 MG PO TABS
20.0000 mg | ORAL_TABLET | Freq: Every morning | ORAL | Status: DC
  Administered 2014-08-10 – 2014-08-15 (×6): 20 mg via ORAL
  Filled 2014-08-09 (×7): qty 2

## 2014-08-09 MED ORDER — DULOXETINE HCL 30 MG PO CPEP
30.0000 mg | ORAL_CAPSULE | Freq: Every morning | ORAL | Status: DC
  Administered 2014-08-10 – 2014-08-15 (×6): 30 mg via ORAL
  Filled 2014-08-09 (×6): qty 1

## 2014-08-09 NOTE — Progress Notes (Signed)
Patient is currently on 100% NRB and is resting comfortably. She is in no respiratory distress at this time. Vitals are stable: HR109, SPO2 100, RR 22, BP100/52. Bipap is not needed at this time.

## 2014-08-09 NOTE — Progress Notes (Signed)
[facility]OCOL FOR AM LAB REPLACEMENT ONLY  The patient does apply for the [facility] Electrolyte [facility] Protocol based on the criteria listed below:   1. Is GFR >/= 40 ml/min? Yes.    Patient's GFR today is >90 2. Is urine output >/= 0.5 ml/kg/hr for the last 6 hours? Yes.   Patient's UOP is .9 ml/kg/hr 3. Is BUN < 60 mg/dL? No.  Patient's BUN today is 9 4. Abnormal electrolyte  K 3.2 5. Ordered repletion with: per protocol 6. If a panic level lab has been reported, has the CCM MD in charge been notified? Yes.  .   Physician:  E Deterding  Ardelle ParkSuits, Hendrik Donath McEachran 08/09/2014 4:50 AM

## 2014-08-09 NOTE — Progress Notes (Addendum)
Patient sats in the mid-upper 80s, patient was on Venti Mask 45%, patient was panicked and air hungry, CCM MD in box notified, CCM rounding MD on unit came to see patient, RT assessed patient as well, patient pulled up in bed, placed on BIPAP, lung sounds rhonchi/diminished, sats quickly improved, patient had immediate relief from BIPAP. Instructed patient and family on the importance of utilizing BIPAP and informed her that we can give her breaks for meals as needed.  After placing patient on BIPAP, patient was given Fentanyl for comfort, MD aware at bedside.

## 2014-08-09 NOTE — Progress Notes (Signed)
[facility] Physician-Brief Progress Note Patient Name: Destiny Chapman DOB: 1956-03-06 MRN: [id number]   Date of Service  08/09/2014  HPI/Events of Note  Continued hypotension on 2 mcg of NE.  Current BP 99/47 (59) with HR of 116.  Received 3 liters of NS for BP support.  Has PIV  eICU Interventions  Plan: 500 cc NS IV times one for BP support MAP goal of 60     Intervention Category Intermediate Interventions: Hypotension - evaluation and management  Tri Chittick 08/09/2014, 2:03 AM

## 2014-08-09 NOTE — Progress Notes (Signed)
Patient stated she wanted something for nerves/anxiety/panic attacks, patient takes several meds at home for anxiety.  Spoke with CCM RN, will wait for MD orders.  Patient taken off BIPAP bc patient could not tolerate it and needed a break, patient placed 100 NRB.

## 2014-08-09 NOTE — Progress Notes (Signed)
[facility] Physician-Brief Progress Note Patient Name: Destiny Chapman DOB: November 25, 1955 MRN: [id number]   Date of Service  08/09/2014  HPI/Events of Note  Temp of 103  eICU Interventions  Order for tylenol suppository q6 hours prn     Intervention Category Minor Interventions: Routine modifications to care plan (e.g. PRN medications for pain, fever)  Destiny Chapman 08/09/2014, 12:29 AM

## 2014-08-09 NOTE — Progress Notes (Addendum)
[facility] Physician-Brief Progress Note Patient Name: Destiny CirriRene Lucien DOB: 1955-10-27 MRN: [id number]   Date of Service  08/09/2014  HPI/Events of Note  Anxiety - pt takes several psychotropic meds chronically  eICU Interventions  Resume aripiprazole and duloxetine @ home dose. Alprazolam 0.25 mg PO TID PRN     Intervention Category Minor Interventions: Agitation / anxiety - evaluation and management  Billy FischerDavid Simonds 08/09/2014, 5:29 PM

## 2014-08-09 NOTE — Progress Notes (Signed)
Utilization Review Completed.Destiny Chapman T3/02/2015  

## 2014-08-09 NOTE — H&P (Signed)
PULMONARY / CRITICAL CARE MEDICINE   Name: Destiny Chapman MRN: [id number] DOB: 16-Mar-1956    ADMISSION DATE:  08/08/2014 CONSULTATION DATE:  08/08/2014  REFERRING MD :  Silverio LayYao   CHIEF COMPLAINT:  Acute hypoxic respiratory failure   INITIAL PRESENTATION:  59 year old [facility] w/ working dx of sepsis and acute hypoxic respiratory failure in the setting of LLL CAP.    STUDIES:  CT chest 3/8: emphysema, b/l PNA LT > RT  SIGNIFICANT EVENTS: 3/09 off pressors  SUBJECTIVE:  Dry cough.  Denies chest pain.  VITAL SIGNS: Temp:  [98 F (36.7 C)-103 F (39.4 C)] 100.5 F (38.1 C) (03/09 0827) Pulse Rate:  [110-125] 115 (03/09 1000) Resp:  [18-39] 32 (03/09 0800) BP: (83-130)/(35-73) 110/61 mmHg (03/09 1000) SpO2:  [82 %-100 %] 97 % (03/09 1000) FiO2 (%):  [80 %-100 %] 80 % (03/09 0822) Weight:  [160 lb (72.576 kg)-163 lb 2.3 oz (74 kg)] 163 lb 2.3 oz (74 kg) (03/09 0500) INTAKE / OUTPUT:  Intake/Output Summary (Last 24 hours) at 08/09/14 1051 Last data filed at 08/09/14 1000  Gross per 24 hour  Intake 2280.51 ml  Output   2325 ml  Net -44.49 ml    PHYSICAL EXAMINATION: General: no distress Neuro: normal strength HEENT: no sinus tenderness Cardiovascular: regular Lungs: basilar crackles Lt > Rt Abdomen:  Soft, non-tender, non-distended Musculoskeletal:  No acute deformity or ROM limitation Skin:  Grossly intact.  LABS:  CBC  Recent Labs Lab 08/08/14 1400 08/08/14 1930 08/09/14 0234  WBC 11.0* 10.9* 11.4*  HGB 14.7 13.7 11.7*  HCT 41.2 39.4 34.0*  PLT 265 221 194   Coag's  Recent Labs Lab 08/08/14 1400  INR 1.26   BMET  Recent Labs Lab 08/08/14 1400 08/08/14 1930 08/09/14 0234  NA 124* 130* 132*  K 3.1* 3.0* 3.2*  CL 95* 101 103  CO2 24 22 20   BUN 13 10 9   CREATININE 1.00 0.85 0.67  GLUCOSE 102* 117* 96   Electrolytes  Recent Labs Lab 08/08/14 1400 08/08/14 1930 08/09/14 0234  CALCIUM 8.1* 7.6* 7.5*  MG  --  1.3*  --    PHOS  --  2.2*  --    Sepsis Markers  Recent Labs Lab 08/08/14 1445 08/08/14 1930 08/08/14 2240  LATICACIDVEN 2.89* 2.5* 2.7*  PROCALCITON  --  7.55  --    Liver Enzymes  Recent Labs Lab 08/08/14 1400 08/08/14 1930  AST 32 32  ALT 17 16  ALKPHOS 47 40  BILITOT 0.9 1.0  ALBUMIN 3.3* 2.6*   Cardiac Enzymes  Recent Labs Lab 08/08/14 1400 08/08/14 1930 08/09/14 0234  TROPONINI <0.03 <0.03 0.03   Glucose  Recent Labs Lab 08/08/14 2043  GLUCAP 104*    Imaging Ct Angio Chest Pe W/cm &/or Wo Cm  08/08/2014   CLINICAL DATA:  Patient passed out today. Syncopal episode today. Short of breath. Hypotension. History of COPD.  EXAM: CT ANGIOGRAPHY CHEST WITH CONTRAST  TECHNIQUE: Multidetector CT imaging of the chest was performed using the standard protocol during bolus administration of intravenous contrast. Multiplanar CT image reconstructions and MIPs were obtained to evaluate the vascular anatomy.  CONTRAST:  100mL OMNIPAQUE IOHEXOL 350 MG/ML SOLN  COMPARISON:  None.  FINDINGS: Bones: Thoracic vertebral body height is preserved. No aggressive osseous lesions. No displaced rib fractures.  Cardiovascular: Technically adequate study for evaluation of pulmonary embolus. Negative for pulmonary embolism. Coronary artery atherosclerosis is present. If office based assessment of  coronary risk factors has not been performed, it is now recommended. Motion artifact is present, mildly degrading the exam. Aortic arch atherosclerosis.  Lungs: There is consolidation of most of the LEFT lower lobe with a dependent distribution. A small area of consolidation in the RIGHT lower lobe is also present. There is underlying emphysema. Upper lobe faint interstitial and airspace opacity.  Central airways: Patent.  No bronchial air-fluid levels.  Effusions: None.  Lymphadenopathy: No axillary adenopathy. Small peritracheal an AP window lymph nodes are probably reactive.  Esophagus: Patulous.  Small hiatal  hernia.  Upper abdomen: Within normal limits.  Other: None.  Review of the MIP images confirms the above findings.  IMPRESSION: 1. Negative for pulmonary embolism or acute aortic abnormality. 2. Atherosclerosis and coronary artery disease. 3. Consolidation of most of the LEFT lower lobe with smaller area of consolidation in the dependent RIGHT lower lobe. The pattern is most compatible with aspiration. 4. Emphysema, with interstitial and airspace opacity in the upper lobes. This may represent aspiration or superimposed pulmonary edema.   Electronically Signed   By: Andreas Newport M.D.   On: 08/08/2014 14:22     ASSESSMENT / PLAN:  PULMONARY A: Acute hypoxic respiratory failure 2nd to PNA. H/o COPD/emphysema. Tobacco abuse. P:   Oxygen to keep SpO2 > 92% F/u CXR intermittently  CARDIOVASCULAR A:  Septic shock >> off pressors 3/09. Syncopal episode in setting to PNA. P:  Monitor hemodynamics  RENAL A:   Hyponatremia - suspect this resolved with volume resuscitation. Hypokalemia. P:   Continue IV fluids NS  GASTROINTESTINAL A:   Nausea and vomiting >> improved. P:   Advance diet PPI for SUP  HEMATOLOGIC A:   No acute issues. P:  Heparin for DVT prophylaxis  Trend CBC Transfuse per ICU protocol   INFECTIOUS A:   CAP. P:   BCx2 3/8>>> Sputum 3/8>>> Urine strep antigen 3/8>> Urine legionella antigen 3/8>> Respiratory viral panel 3/8 >>> Rocephin 3/8>>> Azithro 3/8>>>  ENDOCRINE A:   No acute issues P:   Trend glucose   NEUROLOGIC A:   No acute issues P:   Monitor  CC timed 35 minutes  Updated pt's family at bedside.  Coralyn Helling, MD [facility] Pulmonary/Critical Care 08/09/2014, 10:55 AM Pager:  [phone] After 3pm call: [phone]

## 2014-08-10 DIAGNOSIS — J439 Emphysema, unspecified: Secondary | ICD-10-CM

## 2014-08-10 DIAGNOSIS — R6521 Severe sepsis with septic shock: Secondary | ICD-10-CM

## 2014-08-10 DIAGNOSIS — E871 Hypo-osmolality and hyponatremia: Secondary | ICD-10-CM

## 2014-08-10 DIAGNOSIS — A419 Sepsis, unspecified organism: Secondary | ICD-10-CM

## 2014-08-10 LAB — URINE CULTURE
COLONY COUNT: NO GROWTH
Culture: NO GROWTH

## 2014-08-10 LAB — CLOSTRIDIUM DIFFICILE BY PCR: CDIFFPCR: NEGATIVE

## 2014-08-10 MED ORDER — GUAIFENESIN ER 600 MG PO TB12
600.0000 mg | ORAL_TABLET | Freq: Two times a day (BID) | ORAL | Status: DC
  Administered 2014-08-10 – 2014-08-15 (×10): 600 mg via ORAL
  Filled 2014-08-10 (×12): qty 1

## 2014-08-10 MED ORDER — AZITHROMYCIN 500 MG PO TABS
500.0000 mg | ORAL_TABLET | Freq: Every day | ORAL | Status: DC
  Filled 2014-08-10: qty 1

## 2014-08-10 MED ORDER — DEXTROSE 5 % IV SOLN
500.0000 mg | INTRAVENOUS | Status: DC
  Administered 2014-08-10 – 2014-08-13 (×4): 500 mg via INTRAVENOUS
  Filled 2014-08-10 (×4): qty 500

## 2014-08-10 MED ORDER — IPRATROPIUM-ALBUTEROL 0.5-2.5 (3) MG/3ML IN SOLN: 3.0000 mL | RESPIRATORY_TRACT | Status: DC | PRN

## 2014-08-10 NOTE — Progress Notes (Signed)
PULMONARY / CRITICAL CARE MEDICINE   Name: Destiny Chapman MRN: [id number] DOB: Sep 04, 1955    ADMISSION DATE:  08/08/2014 CONSULTATION DATE:  08/08/2014  REFERRING MD :  Silverio LayYao   CHIEF COMPLAINT:  Acute hypoxic respiratory failure   INITIAL PRESENTATION:  59 [facility] w/ working dx of sepsis and acute hypoxic respiratory failure in the setting of LLL CAP.   STUDIES:  3/8 CT chest >> emphysema, b/l PNA LT > RT  SIGNIFICANT EVENTS: 3/09 off pressors, needed BiPAP.  SUBJECTIVE:  She felt better on BiPAP.  Still has cough with chest congestion.  VITAL SIGNS: Temp:  [97.3 F (36.3 C)-99.2 F (37.3 C)] 97.9 F (36.6 C) (03/10 0800) Pulse Rate:  [96-118] 103 (03/10 0600) Resp:  [18-36] 35 (03/10 0600) BP: (88-118)/(40-86) 88/68 mmHg (03/10 0600) SpO2:  [91 %-100 %] 100 % (03/10 0817) FiO2 (%):  [45 %-100 %] 100 % (03/10 0817) Weight:  [167 lb 15.9 oz (76.2 kg)] 167 lb 15.9 oz (76.2 kg) (03/10 0500) INTAKE / OUTPUT:  Intake/Output Summary (Last 24 hours) at 08/10/14 1005 Last data filed at 08/10/14 0600  Gross per 24 hour  Intake   1420 ml  Output    277 ml  Net   1143 ml    PHYSICAL EXAMINATION: General: ill appearing Neuro: normal strength HEENT: no sinus tenderness Cardiovascular: regular Lungs: basilar crackles Lt > Rt Abdomen:  Soft, non-tender, non-distended Musculoskeletal:  No acute deformity or ROM limitation Skin:  Grossly intact.  LABS:  CBC  Recent Labs Lab 08/08/14 1400 08/08/14 1930 08/09/14 0234  WBC 11.0* 10.9* 11.4*  HGB 14.7 13.7 11.7*  HCT 41.2 39.4 34.0*  PLT 265 221 194   Coag's  Recent Labs Lab 08/08/14 1400  INR 1.26   BMET  Recent Labs Lab 08/08/14 1400 08/08/14 1930 08/09/14 0234  NA 124* 130* 132*  K 3.1* 3.0* 3.2*  CL 95* 101 103  CO2 24 22 20   BUN 13 10 9   CREATININE 1.00 0.85 0.67  GLUCOSE 102* 117* 96   Electrolytes  Recent Labs Lab 08/08/14 1400 08/08/14 1930 08/09/14 0234   CALCIUM 8.1* 7.6* 7.5*  MG  --  1.3*  --   PHOS  --  2.2*  --    Sepsis Markers  Recent Labs Lab 08/08/14 1445 08/08/14 1930 08/08/14 2240  LATICACIDVEN 2.89* 2.5* 2.7*  PROCALCITON  --  7.55  --    Liver Enzymes  Recent Labs Lab 08/08/14 1400 08/08/14 1930  AST 32 32  ALT 17 16  ALKPHOS 47 40  BILITOT 0.9 1.0  ALBUMIN 3.3* 2.6*   Cardiac Enzymes  Recent Labs Lab 08/08/14 1400 08/08/14 1930 08/09/14 0234  TROPONINI <0.03 <0.03 0.03   Glucose  Recent Labs Lab 08/08/14 2043  GLUCAP 104*    Imaging Dg Chest Port 1 View  08/09/2014   CLINICAL DATA:  Community acquired pneumonia, shortness of breath, subsequent encounter.  EXAM: PORTABLE CHEST - 1 VIEW  COMPARISON:  CT chest 08/08/2014.  FINDINGS: Trachea is midline. Heart size normal. Airspace consolidation in the left lower lobe with mild hazy opacification throughout the lungs bilaterally, superimposed on emphysema. Probable atelectasis or scarring in the right midlung zone. No definite pleural fluid.  IMPRESSION: Multilobar pneumonia, worst in the left lower lobe.   Electronically Signed   By: Leanna BattlesMelinda  Blietz M.D.   On: 08/09/2014 07:18     ASSESSMENT / PLAN:  PULMONARY A: Acute hypoxic respiratory failure 2nd  to PNA. H/o COPD/emphysema. Tobacco abuse. P:   Oxygen to keep SpO2 > 92% BiPAP qhs and prn during day >> hopefully can avoid need for intubation F/u CXR Continue brovana, pulmicort, and prn duoneb  CARDIOVASCULAR A:  Septic shock >> off pressors 3/09. Syncopal episode in setting to PNA. P:  Monitor hemodynamics  RENAL A:   Hyponatremia - suspect this resolved with volume resuscitation. Hypokalemia. P:   Continue IV fluids NS Replace electrolytes as needed  GASTROINTESTINAL A:   Nausea and vomiting >> improved. Diarrhea developed 3/10. P:   Clear liquid diet until respiratory status more stable PPI for SUP F/u C diff PCR  HEMATOLOGIC A:   No acute issues. P:  Heparin for  DVT prophylaxis  F/u CBC  INFECTIOUS A:   CAP. P:   Day 3 of rocephin, zithromax  Blood 3/08 >> RSV panel 3/08 >>   ENDOCRINE A:   No acute issues. P:   Trend glucose   NEUROLOGIC A:   Anxiety. P:   Abilify, cymbalta, prn xanax  SUMMARY: Respiratory status remains tenuous.  She feels BiPAP is helping and would like to continue with this for now.  CC timed 35 minutes  Updated pt's family at bedside.  Coralyn Helling, MD [facility] Pulmonary/Critical Care 08/10/2014, 10:05 AM Pager:  [phone] After 3pm call: [phone]

## 2014-08-11 ENCOUNTER — Inpatient Hospital Stay (HOSPITAL_COMMUNITY): Payer: Federal, State, Local not specified - PPO

## 2014-08-11 DIAGNOSIS — J439 Emphysema, unspecified: Secondary | ICD-10-CM

## 2014-08-11 LAB — BASIC METABOLIC PANEL
ANION GAP: 7 (ref 5–15)
BUN: 5 mg/dL — ABNORMAL LOW (ref 6–23)
CHLORIDE: 104 mmol/L (ref 96–112)
CO2: 23 mmol/L (ref 19–32)
Calcium: 8 mg/dL — ABNORMAL LOW (ref 8.4–10.5)
Creatinine, Ser: 0.42 mg/dL — ABNORMAL LOW (ref 0.50–1.10)
GFR calc Af Amer: >90 mL/min (ref >90)
Glucose, Bld: 101 mg/dL — ABNORMAL HIGH (ref 70–99)
POTASSIUM: 3.3 mmol/L — AB (ref 3.5–5.1)
Sodium: 134 mmol/L — ABNORMAL LOW (ref 135–145)

## 2014-08-11 LAB — CBC
HEMATOCRIT: 28.5 % — AB (ref 36.0–46.0)
Hemoglobin: 10.1 g/dL — ABNORMAL LOW (ref 12.0–15.0)
MCH: 32.4 pg (ref 26.0–34.0)
MCHC: 35.4 g/dL (ref 30.0–36.0)
MCV: 91.3 fL (ref 78.0–100.0)
Platelets: 193 10*3/uL (ref 150–400)
RBC: 3.12 MIL/uL — ABNORMAL LOW (ref 3.87–5.11)
RDW: 13.9 % (ref 11.5–15.5)
WBC: 7.5 10*3/uL (ref 4.0–10.5)

## 2014-08-11 LAB — PHOSPHORUS: PHOSPHORUS: 2.5 mg/dL (ref 2.3–4.6)

## 2014-08-11 LAB — MAGNESIUM: Magnesium: 1.7 mg/dL (ref 1.5–2.5)

## 2014-08-11 MED ORDER — ALPRAZOLAM 0.5 MG PO TABS
0.5000 mg | ORAL_TABLET | Freq: Four times a day (QID) | ORAL | Status: DC | PRN
  Administered 2014-08-11 – 2014-08-14 (×6): 0.5 mg via ORAL
  Filled 2014-08-11 (×6): qty 1

## 2014-08-11 MED ORDER — METHYLPREDNISOLONE SODIUM SUCC 40 MG IJ SOLR
40.0000 mg | Freq: Four times a day (QID) | INTRAMUSCULAR | Status: DC
  Administered 2014-08-11 – 2014-08-13 (×9): 40 mg via INTRAVENOUS
  Filled 2014-08-11 (×12): qty 1

## 2014-08-11 MED ORDER — POTASSIUM CHLORIDE CRYS ER 20 MEQ PO TBCR
20.0000 meq | EXTENDED_RELEASE_TABLET | ORAL | Status: AC
  Administered 2014-08-11 (×2): 20 meq via ORAL
  Filled 2014-08-11 (×2): qty 1

## 2014-08-11 NOTE — Progress Notes (Signed)
[facility] ICU Electrolyte Replacement Protocol  Patient Name: Destiny Chapman DOB: [birth year] MRN: [id number]  Date of Service  08/11/2014   HPI/Events of Note    Recent Labs Lab 08/08/14 1400 08/08/14 1930 08/09/14 0234 08/11/14 0215  NA 124* 130* 132* 134*  K 3.1* 3.0* 3.2* 3.3*  CL 95* 101 103 104  CO2 $Re'24 22 20 23  'nvA$ GLUCOSE 102* 117* 96 101*  BUN $Re'13 10 9 'UMO$ 5*  CREATININE 1.00 0.85 0.67 0.42*  CALCIUM 8.1* 7.6* 7.5* 8.0*  MG  --  1.3*  --  1.7  PHOS  --  2.2*  --  2.5    Estimated Creatinine Clearance: 77.2 mL/min (by C-G formula based on Cr of 0.42).  Intake/Output      03/10 0701 - 03/11 0700   I.V. (mL/kg) 900 (12.2)   Other 10   IV Piggyback 300   Total Intake(mL/kg) 1210 (16.4)   Urine (mL/kg/hr) 900 (0.5)   Total Output 900   Net +310       Urine Occurrence 2 x    - I/O DETAILED x24h    Total I/O In: 560 [I.V.:500; Other:10; IV Piggyback:50] Out: 900 [Urine:900] - I/O THIS SHIFT    ASSESSMENT   eICURN Interventions   ICU Electrolyte Replacement Protocol criteria met. Labs Replaced per protocol. MD notified   ASSESSMENT: MAJOR ELECTROLYTE    Lorene Dy 08/11/2014, 5:38 AM

## 2014-08-11 NOTE — Progress Notes (Signed)
PULMONARY / CRITICAL CARE MEDICINE   Name: Kambree Krauss MRN: [id number] DOB: 07-26-1955    ADMISSION DATE:  08/08/2014 CONSULTATION DATE:  08/08/2014  REFERRING MD :  Silverio Lay   CHIEF COMPLAINT:  Acute hypoxic respiratory failure   INITIAL PRESENTATION:  59 year old female admitted from [facility] on 3/8 w/ working dx of sepsis and acute hypoxic respiratory failure in the setting of LLL CAP.   STUDIES:  3/8 CT chest >> emphysema, b/l PNA LT > RT  SIGNIFICANT EVENTS: 3/09 off pressors, needed BiPAP.  SUBJECTIVE:  Has cough with scant sputum.  Doesn't feel like she is getting worse.  Has some wheeze.  Says she was sleep walking last night while on BiPAP.  VITAL SIGNS: Temp:  [97.5 F (36.4 C)-98.7 F (37.1 C)] 97.5 F (36.4 C) (03/11 0756) Pulse Rate:  [90-105] 97 (03/11 0600) Resp:  [17-37] 36 (03/11 0600) BP: (73-130)/(49-71) 106/62 mmHg (03/11 0600) SpO2:  [96 %-100 %] 100 % (03/11 0600) FiO2 (%):  [100 %] 100 % (03/11 0400) Weight:  [162 lb 11.2 oz (73.8 kg)] 162 lb 11.2 oz (73.8 kg) (03/11 0500) INTAKE / OUTPUT:  Intake/Output Summary (Last 24 hours) at 08/11/14 0959 Last data filed at 08/11/14 0900  Gross per 24 hour  Intake   1210 ml  Output   1275 ml  Net    -65 ml    PHYSICAL EXAMINATION: General: ill appearing Neuro: normal strength HEENT: no sinus tenderness Cardiovascular: regular Lungs: basilar crackles Lt > Rt, faint b/l wheezing Abdomen:  Soft, non-tender Musculoskeletal:  No edema Skin: no rashes  LABS:  CBC  Recent Labs Lab 08/08/14 1930 08/09/14 0234 08/11/14 0215  WBC 10.9* 11.4* 7.5  HGB 13.7 11.7* 10.1*  HCT 39.4 34.0* 28.5*  PLT 221 194 193   Coag's  Recent Labs Lab 08/08/14 1400  INR 1.26   BMET  Recent Labs Lab 08/08/14 1930 08/09/14 0234 08/11/14 0215  NA 130* 132* 134*  K 3.0* 3.2* 3.3*  CL 101 103 104  CO2 BUN 10 9 5*  CREATININE 0.85 0.67 0.42*  GLUCOSE 117* 96 101*   Electrolytes  Recent Labs Lab  08/08/14 1930 08/09/14 0234 08/11/14 0215  CALCIUM 7.6* 7.5* 8.0*  MG 1.3*  --  1.7  PHOS 2.2*  --  2.5   Sepsis Markers  Recent Labs Lab 08/08/14 1445 08/08/14 1930 08/08/14 2240  LATICACIDVEN 2.89* 2.5* 2.7*  PROCALCITON  --  7.55  --    Liver Enzymes  Recent Labs Lab 08/08/14 1400 08/08/14 1930  AST 32 32  ALT 17 16  ALKPHOS 47 40  BILITOT 0.9 1.0  ALBUMIN 3.3* 2.6*   Cardiac Enzymes  Recent Labs Lab 08/08/14 1400 08/08/14 1930 08/09/14 0234  TROPONINI <0.03 <0.03 0.03   Glucose  Recent Labs Lab 08/08/14 2043  GLUCAP 104*    Imaging Dg Chest Port 1 View  08/11/2014   CLINICAL DATA:  COPD.  Pneumonia.  EXAM: PORTABLE CHEST - 1 VIEW  COMPARISON:  08/09/2014.  CT 08/08/2014.  FINDINGS: Mediastinum hilar structures are normal. Progressive infiltrates in both upper lobes. Severe left lower lobe infiltrate. No pleural effusion or pneumothorax. Heart size stable. No acute bony abnormality .  IMPRESSION: Progressive upper lobe infiltrates. Persistent dense left lower lobe infiltrate. These findings are consistent with progressive multifocal pneumonia.   Electronically Signed   By: Maisie Fus  Register   On: 08/11/2014 07:54     ASSESSMENT / PLAN:  PULMONARY A:  Acute hypoxic respiratory failure 2nd to PNA. AECOPD with hx of COPD/emphysema. Tobacco abuse. P:   Oxygen to keep SpO2 > 92% BiPAP qhs and prn during day >> hopefully can avoid need for intubation F/u CXR Continue brovana, pulmicort, and prn duoneb More wheezing 3/11 >> add solumedrol  CARDIOVASCULAR A:  Septic shock >> off pressors 3/09. Syncopal episode in setting to PNA. P:  Monitor hemodynamics  RENAL A:   Hyponatremia - suspect this resolved with volume resuscitation. Hypokalemia. P:   Continue IV fluids NS Replace electrolytes as needed  GASTROINTESTINAL A:   Nausea and vomiting >> improved. Diarrhea developed 3/10 >> C diff PCR negative. P:   Clear liquid diet until  respiratory status more stable PPI for SUP  HEMATOLOGIC A:   No acute issues. P:  Heparin for DVT prophylaxis  F/u CBC  INFECTIOUS A:   CAP. P:   Day 4 of rocephin, zithromax  Blood 3/08 >> RSV panel 3/08 >>  ENDOCRINE A:   No acute issues. P:   Trend glucose   NEUROLOGIC A:   Anxiety. P:   Abilify, cymbalta Increase dose of prn xanax to 0.5 mg q6h on 3/11  SUMMARY: More wheezing 3/11 >> add solumedrol.  CC timed 35 minutes  Updated pt's family at bedside.  Coralyn HellingVineet Damek Ende, MD [facility] Pulmonary/Critical Care 08/11/2014, 9:59 AM Pager:  [phone] After 3pm call: [phone]

## 2014-08-11 NOTE — Progress Notes (Signed)
RT note-Patient breifly wore Bipap today, currently on NRB, husband in room at this time visiting.

## 2014-08-11 NOTE — Progress Notes (Signed)
eLink Physician-Brief Progress Note Patient Name: Destiny CirriRene Chapman DOB: 1956/01/27 MRN: [id number]   Date of Service  08/11/2014  HPI/Events of Note  Call from nurse reporting that patient is at risk for falls.  Economistequesting safety sitter.  eICU Interventions  Camera check shows patient attempting to get OOB by self.  She is a fall risk Order for Recruitment consultantsafety sitter.     Intervention Category Major Interventions: Delirium, psychosis, severe agitation - evaluation and management  DETERDING,ELIZABETH 08/11/2014, 11:52 PM

## 2014-08-11 NOTE — Progress Notes (Signed)
Patient was found sitting on the floor beside bed by RN. Patient stated she thought she was at home when she woke up and wanted to walk but needed to sit down. Patient has been assessed with no injury discovered. Q15 Neuro checks for 1 hour were performed. Neuro status intact. Bed alarm is on and patient has been re-educated on using the call bell. MD notified. Pt. Asked that we did not call her family. Pt will continue to be closely assessed and monitored.

## 2014-08-11 NOTE — Progress Notes (Signed)
pts husband called nurse into room stating- "shes really confused." pt stating "I'm in this camper." when asked where she was. Pt was on NRB at the time. Dr. Craige CottaSood notified of confusion, ABG suggested, orders given to monitor pt. PRN Bipap placed. Nursing to continue to monitor pt .

## 2014-08-11 NOTE — Progress Notes (Signed)
Pt repeatedly trying to climb out of bed and pulling at non-rebreather mask. Pt quickly has oxygen desaturations when mask is removed. Even with frequent verbal contact, reorientation and distractions technquies pt continues to climb out of bed. Pt is only orientated to herself.   Pt had a fall last night. [facility] and fall concerns and order for a  Recruitment consultantsafety sitter requested. Charge nurse notified of new order. Will continue to monitor.

## 2014-08-11 NOTE — Progress Notes (Addendum)
Pt taken off Bipap by RT. Pt more alert to person, and place at this time. Nursing to continue to monitor pt. Bed alarm is on.

## 2014-08-11 NOTE — Progress Notes (Signed)
[facility] about pts progressive confusion through out the day. At shift change pt had again pulled off all leads, BP cuff, and oxygen. Pts O2 sats in the 60's. Pt disoriented to place and situation. Pt placed on Bipap.   No new orders at this time. Will continue to monitor closely.

## 2014-08-12 ENCOUNTER — Inpatient Hospital Stay (HOSPITAL_COMMUNITY): Payer: Federal, State, Local not specified - PPO

## 2014-08-12 LAB — BASIC METABOLIC PANEL
Anion gap: 8 (ref 5–15)
BUN: 8 mg/dL (ref 6–23)
CO2: 25 mmol/L (ref 19–32)
Calcium: 8.5 mg/dL (ref 8.4–10.5)
Chloride: 108 mmol/L (ref 96–112)
Creatinine, Ser: 0.36 mg/dL — ABNORMAL LOW (ref 0.50–1.10)
GFR calc Af Amer: >90 mL/min (ref >90)
GFR calc non Af Amer: >90 mL/min (ref >90)
Glucose, Bld: 133 mg/dL — ABNORMAL HIGH (ref 70–99)
POTASSIUM: 3.5 mmol/L (ref 3.5–5.1)
Sodium: 141 mmol/L (ref 135–145)

## 2014-08-12 LAB — URINALYSIS, ROUTINE W REFLEX MICROSCOPIC
Bilirubin Urine: NEGATIVE
Glucose, UA: NEGATIVE mg/dL
Hgb urine dipstick: NEGATIVE
KETONES UR: 40 mg/dL — AB
LEUKOCYTES UA: NEGATIVE
NITRITE: NEGATIVE
PH: 6 (ref 5.0–8.0)
Protein, ur: NEGATIVE mg/dL
Specific Gravity, Urine: 1.014 (ref 1.005–1.030)
Urobilinogen, UA: 0.2 mg/dL (ref 0.0–1.0)

## 2014-08-12 LAB — CBC
HCT: 31.5 % — ABNORMAL LOW (ref 36.0–46.0)
Hemoglobin: 10.9 g/dL — ABNORMAL LOW (ref 12.0–15.0)
MCH: 32.4 pg (ref 26.0–34.0)
MCHC: 34.6 g/dL (ref 30.0–36.0)
MCV: 93.8 fL (ref 78.0–100.0)
PLATELETS: 210 10*3/uL (ref 150–400)
RBC: 3.36 MIL/uL — AB (ref 3.87–5.11)
RDW: 14.1 % (ref 11.5–15.5)
WBC: 3.6 10*3/uL — ABNORMAL LOW (ref 4.0–10.5)

## 2014-08-12 LAB — EXPECTORATED SPUTUM ASSESSMENT W REFEX TO RESP CULTURE

## 2014-08-12 LAB — MAGNESIUM: Magnesium: 1.9 mg/dL (ref 1.5–2.5)

## 2014-08-12 LAB — EXPECTORATED SPUTUM ASSESSMENT W GRAM STAIN, RFLX TO RESP C

## 2014-08-12 MED ORDER — SODIUM CHLORIDE 0.9 % IV SOLN: INTRAVENOUS | Status: DC

## 2014-08-12 MED ORDER — POTASSIUM CHLORIDE CRYS ER 20 MEQ PO TBCR
20.0000 meq | EXTENDED_RELEASE_TABLET | ORAL | Status: AC
  Administered 2014-08-12 (×2): 20 meq via ORAL
  Filled 2014-08-12 (×2): qty 1

## 2014-08-12 NOTE — Progress Notes (Signed)
Called pts husband to let him know pt will be transferred to [facility]. Pts daughter Marchelle Folksmanda answered the phone and stated Mr Lawernce PittsSinsel was asleep. Pts daughter, Marchelle Folksmanda, was updated that pt would be transferring to [facility].

## 2014-08-12 NOTE — Progress Notes (Signed)
I-stat ABG results from 08/11/2014 at 2218 are not for this patient. Wrong label scanned and edit correction [facility].

## 2014-08-12 NOTE — Progress Notes (Signed)
[facility]AC[facility] PROTOCOL FOR AM LAB REPLACEMENT ONLY  The patient does apply [facility] the [facility] Electrolyte Re[facility] Protocol based on the criteria listed below:   1. Is GFR >/= 40 ml/min? Yes.    Patient's GFR today is >90 2. Is urine output >/= 0.5 ml/kg/hr for the last 6 hours? Yes.   Patient's UOP is 1.6 ml/kg/hr 3. Is BUN < 60 mg/dL? Yes.    Patient's BUN today is 8 4. Abnormal electrolyte(s): K=3.5 5. Ordered repletion with: protocol 6. If a panic level lab has been reported, has the CCM MD in charge been notified? No..   Physician:  E Deterding  Cathlean CowerBradshaw, Destiny Chapman Destiny Chapman 08/12/2014 5:17 AM

## 2014-08-12 NOTE — Progress Notes (Signed)
Pt transferred via bed to [facility] room 14 per MD order. Report called to RN. Pt transferred with personal belongings which included 1 black cell phone, 1 tablet and 1 green cell phone charger. Pt tolerated transfer well.

## 2014-08-12 NOTE — Progress Notes (Signed)
eLink Physician-Brief Progress Note Patient Name: Destiny CirriRene Strohmeier DOB: [birth year] MRN: [id number]   Date of Service  08/12/2014  HPI/Events of Note  Last voided at 10pm 3/11.  Now unable to void with greater than 750 cc in bladder by scan.  eICU Interventions  Plan: Insert foley catheter to straight drain.     Intervention Category Intermediate Interventions: Oliguria - evaluation and management  DETERDING,ELIZABETH 08/12/2014, 3:34 AM

## 2014-08-12 NOTE — Progress Notes (Signed)
PULMONARY / CRITICAL CARE MEDICINE   Name: Destiny Chapman MRN: [id number] DOB: 09/03/1955    ADMISSION DATE:  08/08/2014 CONSULTATION DATE:  08/08/2014  REFERRING MD :  Silverio Lay   CHIEF COMPLAINT:  Acute hypoxic respiratory failure   INITIAL PRESENTATION:  59 year old female admitted from [facility] on 3/8 w/ working dx of sepsis and acute hypoxic respiratory failure in the setting of LLL CAP.   STUDIES:  3/8 CT chest >> emphysema, b/l PNA LT > RT  SIGNIFICANT EVENTS: 3/09 off pressors, needed BiPAP.  SUBJECTIVE:  Feels better   VITAL SIGNS: Temp:  [97.2 F (36.2 C)-98 F (36.7 C)] 98 F (36.7 C) (03/12 0813) Pulse Rate:  [90-103] 100 (03/12 0900) Resp:  [20-37] 27 (03/12 0900) BP: (95-144)/(63-103) 139/82 mmHg (03/12 0900) SpO2:  [92 %-100 %] 97 % (03/12 0933) FiO2 (%):  [45 %-70 %] 55 % (03/12 0933) Weight:  [73.6 kg (162 lb 4.1 oz)] 73.6 kg (162 lb 4.1 oz) (03/12 0400) 55% venturi mask  INTAKE / OUTPUT:  Intake/Output Summary (Last 24 hours) at 08/12/14 1054 Last data filed at 08/12/14 0945  Gross per 24 hour  Intake   1620 ml  Output   1350 ml  Net    270 ml    PHYSICAL EXAMINATION: General: ill appearing Neuro: normal strength HEENT: no sinus tenderness Cardiovascular: regular Lungs: decreased on left. No sig wheezing  Abdomen:  Soft, non-tender Musculoskeletal:  No edema Skin: no rashes  LABS:  CBC  Recent Labs Lab 08/09/14 0234 08/11/14 0215 08/12/14 0249  WBC 11.4* 7.5 3.6*  HGB 11.7* 10.1* 10.9*  HCT 34.0* 28.5* 31.5*  PLT 194 193 210   Coag's  Recent Labs Lab 08/08/14 1400  INR 1.26   BMET  Recent Labs Lab 08/09/14 0234 08/11/14 0215 08/12/14 0249  NA 132* 134* 141  K 3.2* 3.3* 3.5  CL 103 104 108  CO2 BUN 9 5* 8  CREATININE 0.67 0.42* 0.36*  GLUCOSE 96 101* 133*   Electrolytes  Recent Labs Lab 08/08/14 1930 08/09/14 0234 08/11/14 0215 08/12/14 0249  CALCIUM 7.6* 7.5* 8.0* 8.5  MG 1.3*  --  1.7 1.9  PHOS 2.2*   --  2.5  --    Sepsis Markers  Recent Labs Lab 08/08/14 1445 08/08/14 1930 08/08/14 2240  LATICACIDVEN 2.89* 2.5* 2.7*  PROCALCITON  --  7.55  --    Liver Enzymes  Recent Labs Lab 08/08/14 1400 08/08/14 1930  AST 32 32  ALT 17 16  ALKPHOS 47 40  BILITOT 0.9 1.0  ALBUMIN 3.3* 2.6*   Cardiac Enzymes  Recent Labs Lab 08/08/14 1400 08/08/14 1930 08/09/14 0234  TROPONINI <0.03 <0.03 0.03   Glucose  Recent Labs Lab 08/08/14 2043  GLUCAP 104*    Imaging Dg Chest Port 1 View  08/12/2014   CLINICAL DATA:  Community acquired pneumonia, COPD, smoker  EXAM: PORTABLE CHEST - 1 VIEW  COMPARISON:  Portable exam 0545 hr compared to 08/11/2014  FINDINGS: Normal heart and mediastinal contours.  Asymmetric pulmonary infiltrates LEFT greater than RIGHT consistent with pneumonia.  Question component of pleural effusion at LEFT base.  No pneumothorax.  Bones demineralized.  IMPRESSION: Asymmetric pulmonary infiltrates LEFT greater RIGHT consistent with pneumonia, little changed.  Cannot exclude small LEFT pleural effusion.   Electronically Signed   By: Ulyses Southward M.D.   On: 08/12/2014 09:19   Dg Chest Port 1 View  08/11/2014   CLINICAL DATA:  COPD.  Pneumonia.  EXAM: PORTABLE CHEST - 1 VIEW  COMPARISON:  08/09/2014.  CT 08/08/2014.  FINDINGS: Mediastinum hilar structures are normal. Progressive infiltrates in both upper lobes. Severe left lower lobe infiltrate. No pleural effusion or pneumothorax. Heart size stable. No acute bony abnormality .  IMPRESSION: Progressive upper lobe infiltrates. Persistent dense left lower lobe infiltrate. These findings are consistent with progressive multifocal pneumonia.   Electronically Signed   By: Maisie Fushomas  Register   On: 08/11/2014 07:54  persistent L>R airspace disease.  US at bedside w/ dense consolidation. No significant fluid on 3/11  ASSESSMENT / PLAN:  PULMONARY A: Acute hypoxic respiratory failure 2nd to PNA. AECOPD with hx of  COPD/emphysema.  Left > right. No sig effusion by bedside US 3/11 Tobacco abuse. P:   Oxygen to keep SpO2 > 92% BiPAP qhs and prn during day  Change to high flow O2 for comfort.  F/u CXR Continue brovana, pulmicort, and prn duoneb More wheezing 3/11 >> added solumedrol   CARDIOVASCULAR A:  Septic shock >> off pressors 3/09. Syncopal episode in setting to PNA. P:  Monitor hemodynamics  RENAL A:   Hyponatremia - suspect this resolved with volume resuscitation. Hypokalemia. P:   Continue IV fluids NS Replace electrolytes as needed  GASTROINTESTINAL A:   Nausea and vomiting >> improved. Diarrhea developed 3/10 >> C diff PCR negative. P:   Adv diet  PPI for SUP  HEMATOLOGIC A:   No acute issues. P:  Heparin for DVT prophylaxis  F/u CBC  INFECTIOUS A:   CAP. P:   Day 5 of rocephin, zithromax  Blood 3/08 >> RSV panel 3/08 >>  ENDOCRINE A:   No acute issues. P:   Trend glucose   NEUROLOGIC A:   Anxiety. P:   Abilify, cymbalta Increase dose of prn xanax to 0.5 mg q6h on 3/11  NP SUMMARY: Wheezing improved. No distress. Will change to high flow O2 more for comfort and mobility. Will look at left chest re: possible effusion. She can transfer to SDU setting.     PCCM ATTENDING: I have reviewed pt's initial presentation, consultants notes and hospital database in detail.  The above assessment and plan was formulated under my direction.  CXR reveals L>R IS and AS dz - esp LLL.  Clinically gradually improving. Transfer to SDU today. Cont to wean O2 as able   Billy Fischeravid Edrie Ehrich, MD;  PCCM service; [place] [phone]

## 2014-08-13 MED ORDER — AZITHROMYCIN 500 MG PO TABS
500.0000 mg | ORAL_TABLET | Freq: Every day | ORAL | Status: DC
  Administered 2014-08-14: 500 mg via ORAL
  Filled 2014-08-13: qty 1

## 2014-08-13 MED ORDER — PANTOPRAZOLE SODIUM 40 MG PO TBEC
40.0000 mg | DELAYED_RELEASE_TABLET | Freq: Two times a day (BID) | ORAL | Status: DC
  Administered 2014-08-13 – 2014-08-15 (×4): 40 mg via ORAL
  Filled 2014-08-13 (×4): qty 1

## 2014-08-13 MED ORDER — IPRATROPIUM-ALBUTEROL 0.5-2.5 (3) MG/3ML IN SOLN: 3.0000 mL | RESPIRATORY_TRACT | Status: DC | PRN

## 2014-08-13 MED ORDER — METHYLPREDNISOLONE SODIUM SUCC 40 MG IJ SOLR
40.0000 mg | Freq: Two times a day (BID) | INTRAMUSCULAR | Status: DC
  Administered 2014-08-13 – 2014-08-15 (×4): 40 mg via INTRAVENOUS
  Filled 2014-08-13 (×5): qty 1

## 2014-08-13 NOTE — Progress Notes (Signed)
PULMONARY / CRITICAL CARE MEDICINE   Name: Destiny Chapman MRN: [id number] DOB: Sep 30, 1955    ADMISSION DATE:  08/08/2014 CONSULTATION DATE:  08/08/2014  REFERRING MD :  Silverio Lay   CHIEF COMPLAINT:  Acute hypoxic respiratory failure   INITIAL PRESENTATION:  58 yowf smoker  admitted from [facility] on 3/8 w/ working dx of sepsis and acute hypoxic respiratory failure in the setting of LLL CAP.   STUDIES:  3/8 CT chest >> emphysema, b/l PNA LT > RT  SIGNIFICANT EVENTS: 3/09 off pressors, needed BiPAP. 3/12 bipap d/c > transferred to step down   SUBJECTIVE:  Feels better - wants to start discharge planning/ lives at [facility]   VITAL SIGNS: Temp:  [97.6 F (36.4 C)-97.9 F (36.6 C)] 97.6 F (36.4 C) (03/13 0736) Pulse Rate:  [94-103] 103 (03/13 0448) Resp:  [19-94] 19 (03/13 0448) BP: (125-159)/(69-89) 144/80 mmHg (03/13 0400) SpO2:  [87 %-94 %] 92 % (03/13 0902) FiO2 (%):  [50 %] 50 % (03/13 0902) Weight:  [167 lb 1.6 oz (75.796 kg)] 167 lb 1.6 oz (75.796 kg) (03/13 0357)    INTAKE / OUTPUT:  Intake/Output Summary (Last 24 hours) at 08/13/14 1141 Last data filed at 08/13/14 0600  Gross per 24 hour  Intake 1380.33 ml  Output   1670 ml  Net -289.67 ml    PHYSICAL EXAMINATION: General: chronically ill appearing  Neuro: normal strength HEENT: no sinus tenderness Cardiovascular: regular Lungs: junky insp and exp rhonchi bilaterally  Abdomen:  Soft, non-tender Musculoskeletal:  No edema Skin: no rashes  LABS:  CBC  Recent Labs Lab 08/09/14 0234 08/11/14 0215 08/12/14 0249  WBC 11.4* 7.5 3.6*  HGB 11.7* 10.1* 10.9*  HCT 34.0* 28.5* 31.5*  PLT 194 193 210   Coag's  Recent Labs Lab 08/08/14 1400  INR 1.26   BMET  Recent Labs Lab 08/09/14 0234 08/11/14 0215 08/12/14 0249  NA 132* 134* 141  K 3.2* 3.3* 3.5  CL 103 104 108  CO2 BUN 9 5* 8  CREATININE 0.67 0.42* 0.36*  GLUCOSE 96 101* 133*   Electrolytes  Recent Labs Lab 08/08/14 1930  08/09/14 0234 08/11/14 0215 08/12/14 0249  CALCIUM 7.6* 7.5* 8.0* 8.5  MG 1.3*  --  1.7 1.9  PHOS 2.2*  --  2.5  --    Sepsis Markers  Recent Labs Lab 08/08/14 1445 08/08/14 1930 08/08/14 2240  LATICACIDVEN 2.89* 2.5* 2.7*  PROCALCITON  --  7.55  --    Liver Enzymes  Recent Labs Lab 08/08/14 1400 08/08/14 1930  AST 32 32  ALT 17 16  ALKPHOS 47 40  BILITOT 0.9 1.0  ALBUMIN 3.3* 2.6*   Cardiac Enzymes  Recent Labs Lab 08/08/14 1400 08/08/14 1930 08/09/14 0234  TROPONINI <0.03 <0.03 0.03   Glucose  Recent Labs Lab 08/08/14 2043  GLUCAP 104*    Imaging Dg Chest Port 1 View  08/12/2014   CLINICAL DATA:  Community acquired pneumonia, COPD, smoker  EXAM: PORTABLE CHEST - 1 VIEW  COMPARISON:  Portable exam 0545 hr compared to 08/11/2014  FINDINGS: Normal heart and mediastinal contours.  Asymmetric pulmonary infiltrates LEFT greater than RIGHT consistent with pneumonia.  Question component of pleural effusion at LEFT base.  No pneumothorax.  Bones demineralized.  IMPRESSION: Asymmetric pulmonary infiltrates LEFT greater RIGHT consistent with pneumonia, little changed.  Cannot exclude small LEFT pleural effusion.   Electronically Signed   By: Ulyses Southward M.D.   On: 08/12/2014 09:19   [place]  at bedside 3/11  w/ dense consolidation.    ASSESSMENT / PLAN:  PULMONARY A: Acute hypoxic respiratory failure 2nd to PNA. AECOPD with hx of COPD/emphysema.  Left > right. No sig effusion by bedside US 3/11 Tobacco use up until admit P:   Oxygen to keep SpO2 > 92% Continue brovana, pulmicort, and prn duoneb     CARDIOVASCULAR A:  Septic shock >> off pressors 3/09. Syncopal episode in setting to PNA P:   monitor off pressors   RENAL A:   Hyponatremia -   resolved with volume resuscitation. Hypokalemia, resolved 3/12  P:   Continue IV fluids NS    GASTROINTESTINAL A:   Nausea and vomiting >> resolved . Diarrhea developed 3/10 >> C diff PCR negative. P:   PPI  for SUP  HEMATOLOGIC A:   No acute issues. P:  Heparin for DVT prophylaxis  F/u CBC  INFECTIOUS Legionella Ant 3/8>  neg Strep urinary ag 3/8 > neg Blood 3/08 >> RSV panel 3/08 >> A:   CAP. P:   Zmax 3/8 >> Roc 3/8 >>     ENDOCRINE A:   No acute issues. P:   Trend glucose   NEUROLOGIC A:   Anxiety. P:   Abilify, cymbalta Increased dose of prn xanax to 0.5 mg q6h on 3/11    Summary Improved but still 02 dep/ junky rhonchi s/p smoking cessation at admit  Recheck cxr 3/14 and mobilize/ d/c foley   Sandrea HughsMichael Talani Brazee, MD Pulmonary and Critical Care Medicine [facility] Cell [phone] After 5:30 PM or weekends, call [phone]

## 2014-08-14 ENCOUNTER — Inpatient Hospital Stay (HOSPITAL_COMMUNITY): Payer: Federal, State, Local not specified - PPO

## 2014-08-14 DIAGNOSIS — J810 Acute pulmonary edema: Secondary | ICD-10-CM

## 2014-08-14 DIAGNOSIS — J432 Centrilobular emphysema: Secondary | ICD-10-CM

## 2014-08-14 LAB — POCT I-STAT 3, ART BLOOD GAS (G3+)
Acid-base deficit: 5 mmol/L — ABNORMAL HIGH (ref 0.0–2.0)
Bicarbonate: 21.6 mEq/L (ref 20.0–24.0)
O2 Saturation: 90 %
PH ART: 7.271 — AB (ref 7.350–7.450)
PO2 ART: 68 mmHg — AB (ref 80.0–100.0)
Patient temperature: 99.2
TCO2: 23 mmol/L (ref 0–100)
pCO2 arterial: 47.2 mmHg — ABNORMAL HIGH (ref 35.0–45.0)

## 2014-08-14 LAB — BASIC METABOLIC PANEL
Anion gap: 10 (ref 5–15)
BUN: 9 mg/dL (ref 6–23)
CALCIUM: 8.6 mg/dL (ref 8.4–10.5)
CO2: 26 mmol/L (ref 19–32)
Chloride: 103 mmol/L (ref 96–112)
Creatinine, Ser: 0.51 mg/dL (ref 0.50–1.10)
GFR calc Af Amer: >90 mL/min (ref >90)
GLUCOSE: 118 mg/dL — AB (ref 70–99)
Potassium: 3.3 mmol/L — ABNORMAL LOW (ref 3.5–5.1)
Sodium: 139 mmol/L (ref 135–145)

## 2014-08-14 LAB — CULTURE, BLOOD (ROUTINE X 2)
Culture: NO GROWTH
Culture: NO GROWTH

## 2014-08-14 MED ORDER — FUROSEMIDE 10 MG/ML IJ SOLN
40.0000 mg | Freq: Four times a day (QID) | INTRAMUSCULAR | Status: AC
  Administered 2014-08-14 – 2014-08-15 (×3): 40 mg via INTRAVENOUS
  Filled 2014-08-14 (×3): qty 4

## 2014-08-14 NOTE — Evaluation (Signed)
Physical Therapy Evaluation Patient Details Name: Destiny Chapman MRN: [id number] DOB: 14-Apr-1956 Today's Date: 08/14/2014   History of Present Illness  58 yowf smoker [facility] w/ working dx of sepsis and acute hypoxic respiratory failure in the setting of LLL CAP  Clinical Impression  Patient demonstrates deficits in functional mobility as indicated below. Will need continued skilled PT to address deficits and maximize function. Will see as indicated and progress as tolerated.  OF NOTE: patient saturating at 95% on 5 liters, ambulated on 6 liters with saturations dropping to uppers 80s, improved with rest. HR elevated 120s.     Follow Up Recommendations Home health PT;Supervision/Assistance - 24 hour (if going home on O2 (otherwise no PT follow up))    Equipment Recommendations  None recommended by PT    Recommendations for Other Services       Precautions / Restrictions Precautions Precautions: Fall (watch O2 saturations) Restrictions Weight Bearing Restrictions: No      Mobility  Bed Mobility Overal bed mobility: Modified Independent             General bed mobility comments: increased time to perform, no physical assist required  Transfers Overall transfer level: Needs assistance Equipment used: 1 person hand held assist Transfers: Sit to/from Stand Sit to Stand: Min guard         General transfer comment: VCs for upright posture, and hand placement to control descent to chair  Ambulation/Gait Ambulation/Gait assistance: Min guard Ambulation Distance (Feet): 60 Feet Assistive device: 1 person hand held assist Gait Pattern/deviations: Step-through pattern;Decreased stride length Gait velocity: decreased Gait velocity interpretation: Below normal speed for age/gender General Gait Details: generalized weakness with saturations dropping to 89% on 6 liters Destiny Chapman  Stairs            Wheelchair Mobility    Modified Rankin (Stroke Patients  Only)       Balance                                             Pertinent Vitals/Pain Pain Assessment: 0-10 Pain Score: 8  Pain Location: low back    Home Living Family/patient expects to be discharged to:: Private residence Living Arrangements: Spouse/significant other Available Help at Discharge: Family Type of Home: House Home Access: Stairs to enter Entrance Stairs-Rails: None Entrance Stairs-Number of Steps: 3 Home Layout: One level Home Equipment: Cane - single point;Wheelchair - manual      Prior Function Level of Independence: Independent with assistive device(s)               Hand Dominance   Dominant Hand: Right    Extremity/Trunk Assessment               Lower Extremity Assessment: Generalized weakness;LLE deficits/detail (left leg)         Communication   Communication:  (wears glasses)  Cognition Arousal/Alertness: Awake/alert Behavior During Therapy: WFL for tasks assessed/performed Overall Cognitive Status: Within Functional Limits for tasks assessed                      General Comments General comments (skin integrity, edema, etc.): overall deconditioning noted with activity    Exercises        Assessment/Plan    PT Assessment Patient needs continued PT services  PT Diagnosis Difficulty walking   PT Problem List Decreased  strength;Decreased activity tolerance;Decreased balance;Decreased mobility;Cardiopulmonary status limiting activity  PT Treatment Interventions Gait training;Stair training;Functional mobility training;Therapeutic activities;Therapeutic exercise;Balance training;Patient/family education   PT Goals (Current goals can be found in the Care Plan section) Acute Rehab PT Goals Patient Stated Goal: to go home PT Goal Formulation: With patient Time For Goal Achievement: 08/28/14 Potential to Achieve Goals: Good    Frequency Min 3X/week   Barriers to discharge         Co-evaluation               End of Session Equipment Utilized During Treatment: Gait belt;Oxygen Activity Tolerance: Patient tolerated treatment well;Patient limited by fatigue Patient left: in chair;with call bell/phone within reach Nurse Communication: Mobility status         Time: 1610-9604 PT Time Calculation (min) (ACUTE ONLY): 21 min   Charges:   PT Evaluation $Initial PT Evaluation Tier I: 1 Procedure     PT G CodesFabio Chapman 09/05/2014, 3:40 PM Destiny Chapman, PT DPT  [phone]

## 2014-08-14 NOTE — Progress Notes (Signed)
PULMONARY / CRITICAL CARE MEDICINE   Name: Destiny Chapman MRN: [id number] DOB: [birth year]    ADMISSION DATE:  08/08/2014 CONSULTATION DATE:  08/08/2014  REFERRING MD :  Silverio Lay   CHIEF COMPLAINT:  Acute hypoxic respiratory failure   INITIAL PRESENTATION:  58 yowf smoker  admitted from [facility] on 3/8 w/ working dx of sepsis and acute hypoxic respiratory failure in the setting of LLL CAP.   STUDIES:  3/8 CT chest >> emphysema, b/l PNA LT > RT  SIGNIFICANT EVENTS: 3/09 off pressors, needed BiPAP. 3/12 bipap d/c > transferred to step down  SUBJECTIVE:  Feels better - wants to start discharge planning/ lives at [facility]   VITAL SIGNS: Temp:  [97 F (36.1 C)-97.7 F (36.5 C)] 97.7 F (36.5 C) (03/14 0747) Pulse Rate:  [89-114] 114 (03/14 0847) Resp:  [15-25] 15 (03/14 0847) BP: (134-149)/(84-96) 142/84 mmHg (03/14 0847) SpO2:  [90 %-96 %] 94 % (03/14 0847) FiO2 (%):  [45 %-55 %] 55 % (03/14 0847) Weight:  [75.5 kg (166 lb 7.2 oz)] 75.5 kg (166 lb 7.2 oz) (03/14 0428)   INTAKE / OUTPUT:  Intake/Output Summary (Last 24 hours) at 08/14/14 1107 Last data filed at 08/14/14 0917  Gross per 24 hour  Intake    560 ml  Output   1701 ml  Net  -1141 ml   PHYSICAL EXAMINATION: General: chronically ill appearing  Neuro: Alert, interactive, moving all ext to command, normal strength HEENT: [place]/AT, PERRL, EOM-I and MMM. Cardiovascular: RRR, Nl S1/S2, -M/R/G. Lungs: Bibasilar crackles Abdomen:  Soft, non-tender, ND and +BS. Musculoskeletal:  No edema and -tenderness. Skin: no rashes  LABS:  CBC  Recent Labs Lab 08/09/14 0234 08/11/14 0215 08/12/14 0249  WBC 11.4* 7.5 3.6*  HGB 11.7* 10.1* 10.9*  HCT 34.0* 28.5* 31.5*  PLT 194 193 210   Coag's  Recent Labs Lab 08/08/14 1400  INR 1.26   BMET  Recent Labs Lab 08/09/14 0234 08/11/14 0215 08/12/14 0249  NA 132* 134* 141  K 3.2* 3.3* 3.5  CL 103 104 108  CO2 BUN 9 5* 8  CREATININE 0.67 0.42* 0.36*  GLUCOSE 96  101* 133*   Electrolytes  Recent Labs Lab 08/08/14 1930 08/09/14 0234 08/11/14 0215 08/12/14 0249  CALCIUM 7.6* 7.5* 8.0* 8.5  MG 1.3*  --  1.7 1.9  PHOS 2.2*  --  2.5  --    Sepsis Markers  Recent Labs Lab 08/08/14 1445 08/08/14 1930 08/08/14 2240  LATICACIDVEN 2.89* 2.5* 2.7*  PROCALCITON  --  7.55  --    Liver Enzymes  Recent Labs Lab 08/08/14 1400 08/08/14 1930  AST 32 32  ALT 17 16  ALKPHOS 47 40  BILITOT 0.9 1.0  ALBUMIN 3.3* 2.6*   Cardiac Enzymes  Recent Labs Lab 08/08/14 1400 08/08/14 1930 08/09/14 0234  TROPONINI <0.03 <0.03 0.03   Glucose  Recent Labs Lab 08/08/14 2043  GLUCAP 104*    Imaging Dg Chest 2 View  08/14/2014   CLINICAL DATA:  Community acquired pneumonia.  Dyspnea.  EXAM: CHEST  2 VIEW  COMPARISON:  08/12/2014  FINDINGS: Dense consolidation persists in the left base without significant interval change. There is a left pleural effusion, probably unchanged. Generalized interstitial thickening persists, indeterminate with regard to fibrosis versus interstitial fluid. This is unchanged.  IMPRESSION: Unchanged consolidation and effusion in the left base, likely pneumonia with a parapneumonic effusion. Cannot exclude a small component of congestive heart failure, with generalized interstitial thickening.  Electronically Signed   By: Ellery Plunkaniel R Mitchell M.D.   On: 08/14/2014 06:55   US at bedside 3/11  w/ dense consolidation.    ASSESSMENT / PLAN:  PULMONARY A: Acute hypoxic respiratory failure 2nd to PNA. AECOPD with hx of COPD/emphysema.  Left > right. No sig effusion by bedside US 3/11 Tobacco use up until admit Remains hypoxic at 50% mask. P:   Oxygen to keep SpO2 > 92% Continue brovana, pulmicort, and prn duoneb Attempt to titrate O2 down as able. Add lasix 40 mg IV q6 x3 doses.  CARDIOVASCULAR A:  Septic shock >> off pressors 3/09. Syncopal episode in setting to PNA P:  BP slightly elevated but will not add anti-HTN  while actively diureses.  RENAL A:   Hyponatremia -   resolved with volume resuscitation. Hypokalemia, resolved 3/12  P:   KVO IVF. Lasix 40 mg IV q6 x3 doses. BMET in AM. Replace electrolytes as indicated.   GASTROINTESTINAL A:   Nausea and vomiting >> resolved . Diarrhea developed 3/10 >> C diff PCR negative. P:   PPI for SUP  HEMATOLOGIC A:   No acute issues. P:  Heparin for DVT prophylaxis  F/u CBC  INFECTIOUS Legionella Ant 3/8>  neg Strep urinary ag 3/8 > neg Blood 3/08 >>NTD RSV panel 3/08 >>There is a RSV panel in the result section but I am unable to see it when I open it. A:   CAP. P:   Zmax 3/8 >>3/14 Roc 3/8 >>3/15 Will continue to look for respiratory viral panel.  ENDOCRINE A:   No acute issues. P:   Trend glucose   NEUROLOGIC A:   Anxiety. P:   Abilify, cymbalta Increased dose of prn xanax to 0.5 mg q6h on 3/11  Summary Remains on high flow O2, at 50% at this point, will titrate down for sat of 88-92%.  Diureses as above.  Check BMET now since will be diuresing and nothing is drawn.  Will d/c zithromax today and rocephin in AM.  Only barrier to discharge right now is high O2 demand.  Hopefully diureses will assist with that and patient will be able to be discharged on nasal cannula [place].  PT/OT evaluation.  IS per RT protocol.  OOB to chair.  Alyson ReedyWesam G. Yacoub, M.D. [facility] Pulmonary/Critical Care Medicine. Pager: [phone]. After hours pager: [phone].

## 2014-08-15 ENCOUNTER — Encounter (HOSPITAL_COMMUNITY): Payer: Self-pay | Admitting: Pulmonary Disease

## 2014-08-15 ENCOUNTER — Inpatient Hospital Stay (HOSPITAL_COMMUNITY): Payer: Federal, State, Local not specified - PPO

## 2014-08-15 DIAGNOSIS — R0602 Shortness of breath: Secondary | ICD-10-CM | POA: Insufficient documentation

## 2014-08-15 DIAGNOSIS — J189 Pneumonia, unspecified organism: Secondary | ICD-10-CM | POA: Insufficient documentation

## 2014-08-15 LAB — CBC
HEMATOCRIT: 39.4 % (ref 36.0–46.0)
HEMOGLOBIN: 13.9 g/dL (ref 12.0–15.0)
MCH: 32.3 pg (ref 26.0–34.0)
MCHC: 35.3 g/dL (ref 30.0–36.0)
MCV: 91.6 fL (ref 78.0–100.0)
Platelets: 432 10*3/uL — ABNORMAL HIGH (ref 150–400)
RBC: 4.3 MIL/uL (ref 3.87–5.11)
RDW: 13.8 % (ref 11.5–15.5)
WBC: 7.1 10*3/uL (ref 4.0–10.5)

## 2014-08-15 LAB — CULTURE, RESPIRATORY W GRAM STAIN: Gram Stain: NONE SEEN

## 2014-08-15 LAB — BASIC METABOLIC PANEL
Anion gap: 15 (ref 5–15)
BUN: 6 mg/dL (ref 6–23)
CO2: 30 mmol/L (ref 19–32)
Calcium: 8.7 mg/dL (ref 8.4–10.5)
Chloride: 93 mmol/L — ABNORMAL LOW (ref 96–112)
Creatinine, Ser: 0.5 mg/dL (ref 0.50–1.10)
GFR calc non Af Amer: >90 mL/min (ref >90)
GLUCOSE: 126 mg/dL — AB (ref 70–99)
POTASSIUM: 3.2 mmol/L — AB (ref 3.5–5.1)
Sodium: 138 mmol/L (ref 135–145)

## 2014-08-15 LAB — RESPIRATORY VIRUS PANEL
Adenovirus: NEGATIVE
INFLUENZA A: NEGATIVE
Influenza B: NEGATIVE
Metapneumovirus: NEGATIVE
PARAINFLUENZA 2 A: NEGATIVE
Parainfluenza 1: NEGATIVE
Parainfluenza 3: NEGATIVE
RESPIRATORY SYNCYTIAL VIRUS A: NEGATIVE
RESPIRATORY SYNCYTIAL VIRUS B: NEGATIVE
RHINOVIRUS: NEGATIVE

## 2014-08-15 LAB — PHOSPHORUS: Phosphorus: 4.1 mg/dL (ref 2.3–4.6)

## 2014-08-15 LAB — MAGNESIUM: Magnesium: 1.7 mg/dL (ref 1.5–2.5)

## 2014-08-15 LAB — CULTURE, RESPIRATORY

## 2014-08-15 MED ORDER — FLUTICASONE-SALMETEROL 250-50 MCG/DOSE IN AEPB
1.0000 | INHALATION_SPRAY | Freq: Two times a day (BID) | RESPIRATORY_TRACT | Status: AC
Start: 2014-08-15 — End: ?

## 2014-08-15 MED ORDER — ALBUTEROL SULFATE HFA 108 (90 BASE) MCG/ACT IN AERS: 2.0000 | INHALATION_SPRAY | Freq: Four times a day (QID) | RESPIRATORY_TRACT | Status: AC | PRN

## 2014-08-15 MED ORDER — PREDNISONE 20 MG PO TABS: ORAL_TABLET | ORAL | Status: AC

## 2014-08-15 NOTE — Progress Notes (Addendum)
O2 sats 88% room air. Sats back up to 94% with 2L O2 via [place]. Lorin PicketLindsey Brailyn Killion, RN

## 2014-08-15 NOTE — Discharge Summary (Signed)
Physician Discharge Summary  Patient ID: Destiny Chapman MRN: [id number] DOB/AGE: Sep 30, 1955 59 y.o.  Admit date: 08/08/2014 Discharge date: 08/15/2014    Discharge Diagnoses:  Acute Hypoxic Respiratory Failure  Community Acquired PNA Acute Exacerbation of COPD Left Pleural Effusion  Tobacco Abuse  Septic Shock  Syncope  Hyponatremia Hypokalemia  Nausea / Vomiting  Diarrhea  Anxiety  Depression  Chronic Pain                                                                         DISCHARGE PLAN BY DIAGNOSIS     Acute Hypoxic Respiratory Failure  Community Acquired PNA Acute Exacerbation of COPD Left Pleural Effusion  Tobacco Abuse   Discharge Plan: Oxygen at 2L per [place] continuously.  Home O2 arranged prior to discharge.  Follow up with PCP as below.   Will need repeat CXR at time of follow up to review L effusion.  If larger in size, consider diagnostic / therapeutic thoracentesis.  Smoking cessation  Continue Spiriva, Advair, PRN albuterol HFA   Septic Shock  Syncope - in setting of CAP / Septic Shock  Discharge Plan: Resolved, no further follow up at time of discharge.   Hyponatremia Hypokalemia   Discharge Plan: Follow up BMP with PCP   Nausea / Vomiting  Diarrhea - CDiff negative   Discharge Plan: Resolved, no further follow up at time of discharge.   Anxiety  Depression  Chronic Pain   Discharge Plan: Patient on multiple narcotics / sedating medications.  Concern for pulmonary impact in setting of COPD.  Hold medications as below.                     DISCHARGE SUMMARY   Destiny Chapman is a 59 y.o. y/o female with a PMH of tobacco abuse (1.5 ppd), anxiety / depression and COPD who presented to [facility] ER on 3/08 with several days of productive cough, progressive shortness of breath, syncope, nausea with occasional vomiting and 2 episodes of diarrhea.   The patient was attempting to go for a walk with her family on the day of admission  when she became progressively more SOB.  She attempted to continue on with the walk but then passed out.  On arrival to ER, she was noted to be hypoxic with a room air saturation of 82%, hypotensive (sbp 80's) and tachycardic.  CTA of the chest was obtained to rule out PE and was negative.  It did show atherosclerosis / CAD, consolidation LLL, small area consolidation RLL (pattern concerning for aspiration), emphysema.  She was treated with broad spectrum antibiotics, IV fluids and oxygen.   The patient was placed on scheduled brovana / budesonide and closely observed in the ICU for need of intubation.  She required vasopressors to maintain a MAP > 65. The patient was pan cultured which were negative.  She remained on vasopressors for approximately 24 hours. ICU course complicated by intermittent need of BiPAP for increased work of breathing until 3/12 when she transitioned to SDU.  She continued to require high flow O2 upon transfer.  The patient made slow improvement.  She was evaluated by PT for discharge needs (home supervision recommended).  She completed 8 days total  abx while inpatient.  Steroids were transitioned to PO for discharge.  Ambulatory desaturation was assessed and at rest she dropped to 88%.  Oxygen was arranged for 2L/Destiny Chapman continuously for discharge.  The patient was medically cleared for discharge with plans as above.                 SIGNIFICANT DIAGNOSTIC STUDIES 3/08  CTA Chest >> negative for PE or acute aortic abnormality, atherosclerosis / CAD, consolidation LLL, small area consolidation RLL (pattern concerning for aspiration), emphysema  3/15  CXR >> LLL airspace consolidation with small effusion, clear otherwise   MICRO DATA  Legionella Ant 3/8 >> neggative Strep urinary ag 3/8 >> negative  Sputum 3/12 >> few candida albicans  Blood 3/08 >> negative  RSV panel 3/08 >> negative  C-Diff PCR 3/09 >> negative  Urine Culture 3/08 >> negative   ANTIBIOTICS Rocephin 3/08 >>  3/15 Azithromycin 3/8 >> 3/14 Zosyn 3/08 x1  Vanco 3/08 x 1     Discharge Exam: General: chronically ill in NAD, up to chair Neuro: AAOx4, speech clear, MAE CV: s1s2 rrr, no m/r/g PULM: resp's even/non-labored, lungs bilaterally distant but clear.  No wheezes GI: NTND, bsx4 active  Extremities: warm/dry, no edema, no rashes.  Scattered bruises on upper extremities  08/15/14  CXR   Filed Vitals:   08/15/14 0408 08/15/14 0812 08/15/14 0921 08/15/14 1218  BP: 127/80 113/77 113/77 120/72  Pulse: 103 116  112  Temp: 98.4 F (36.9 C) 98 F (36.7 C)  97.3 F (36.3 C)  TempSrc: Oral Oral  Oral  Resp: $Remo'16 13  24  'oyOhK$ Height:      Weight: 153 lb 14.1 oz (69.8 kg)     SpO2: 93% 92% 93% 93%     Discharge Labs  BMET  Recent Labs Lab 08/08/14 1930 08/09/14 0234 08/11/14 0215 08/12/14 0249 08/14/14 1226 08/15/14 0326  NA 130* 132* 134* 141 139 138  K 3.0* 3.2* 3.3* 3.5 3.3* 3.2*  CL 101 103 104 108 103 93*  CO2 $Re'22 20 23 25 26 30  'LmP$ GLUCOSE 117* 96 101* 133* 118* 126*  BUN 10 9 5* $Re'8 9 6  'kkX$ CREATININE 0.85 0.67 0.42* 0.36* 0.51 0.50  CALCIUM 7.6* 7.5* 8.0* 8.5 8.6 8.7  MG 1.3*  --  1.7 1.9  --  1.7  PHOS 2.2*  --  2.5  --   --  4.1   CBC  Recent Labs Lab 08/11/14 0215 08/12/14 0249 08/15/14 0326  HGB 10.1* 10.9* 13.9  HCT 28.5* 31.5* 39.4  WBC 7.5 3.6* 7.1  PLT 193 210 432*    Discharge Instructions    Call MD for:  difficulty breathing, headache or visual disturbances    Complete by:  As directed      Call MD for:  extreme fatigue    Complete by:  As directed      Call MD for:  hives    Complete by:  As directed      Call MD for:  persistant dizziness or light-headedness    Complete by:  As directed      Call MD for:  persistant nausea and vomiting    Complete by:  As directed      Call MD for:  severe uncontrolled pain    Complete by:  As directed      Call MD for:  temperature >100.4    Complete by:  As directed      Diet - low sodium  heart healthy     Complete by:  As directed      Discharge instructions    Complete by:  As directed   1.  Review your medications carefully as they have changed 2.  Do not take medications that are not on the list 3.  Keep your follow up appointment with Dr. Justine Null 4.  Take all of your medications with you to your appointment 5.  Wear your oxygen continuously at 2L.  NO SMOKING near the oxygen!     5.     Increase activity slowly    Complete by:  As directed                 Follow-up Information    Follow up with Domingo Pulse, MD On 08/18/2014.   Why:  Appt at 11:15 AM    Contact information:   [address] [place], [place] [address]  Ph: [phone]         Medication List    STOP taking these medications        cyclobenzaprine 10 MG tablet  Commonly known as:  FLEXERIL     escitalopram 20 MG tablet  Commonly known as:  LEXAPRO     nortriptyline 50 MG capsule  Commonly known as:  PAMELOR     oxyCODONE 15 MG immediate release tablet  Commonly known as:  ROXICODONE     Oxycodone HCl 10 MG Tabs      TAKE these medications        ABILIFY 20 MG tablet  Generic drug:  ARIPiprazole  Take 20 mg by mouth every morning.     ADVIL ALLERGY SINUS PO  Take 1 tablet by mouth 3 (three) times daily as needed (allergy symptoms).     albuterol 108 (90 BASE) MCG/ACT inhaler  Commonly known as:  PROVENTIL HFA;VENTOLIN HFA  Inhale 2 puffs into the lungs every 6 (six) hours as needed for wheezing or shortness of breath.     ALPRAZolam 0.25 MG tablet  Commonly known as:  XANAX  Take 0.25 mg by mouth 2 (two) times daily as needed. Use sparingly.  May be habit forming.     DULoxetine 30 MG capsule  Commonly known as:  CYMBALTA  Take 30 mg by mouth every morning.     Fluticasone-Salmeterol 250-50 MCG/DOSE Aepb  Commonly known as:  ADVAIR DISKUS  Inhale 1 puff into the lungs every 12 (twelve) hours.     HORIZANT 600 MG Tbcr  Generic drug:  Gabapentin Enacarbil  Take 600 mg by mouth. Take  1 tablet by mouth with evening meal.     LINZESS 290 MCG Caps capsule  Generic drug:  Linaclotide  Take 290 mcg by mouth daily.     nystatin 100000 UNIT/ML suspension  Commonly known as:  MYCOSTATIN  Swish and swallow 5-10 ml up to 5 times daily.     omeprazole 40 MG capsule  Commonly known as:  PRILOSEC  Take 40 mg by mouth daily as needed.     predniSONE 20 MG tablet  Commonly known as:  DELTASONE  2 tabs for 2 days, then 1 tab for 2 days, then 1/2 tab for 2 days, then stop     primidone 50 MG tablet  Commonly known as:  MYSOLINE  Take 50 mg by mouth. Take 1 tablet by mouth every morning.  Take 3 tablets every evening at bedtime.     promethazine 25 MG tablet  Commonly known as:  PHENERGAN  Take 25 mg by mouth every 6 (six) hours as needed for nausea or vomiting.     rizatriptan 10 MG tablet  Commonly known as:  MAXALT  Take 10 mg by mouth. Take 1 tablet by mouth at onset of headache, may repeat every 2 hours as needed, max of 3 tablets in 24 hours.     SPIRIVA HANDIHALER 18 MCG inhalation capsule  Generic drug:  tiotropium  Place 18 mcg into inhaler and inhale daily.          Disposition:  Home.  Oxygen 2L per [place] continuously arranged for discharge.    Discharged Condition: Destiny Chapman has met maximum benefit of inpatient care and is medically stable and cleared for discharge.  Patient is pending follow up as above.      Time spent on disposition:  Greater than 35 minutes.   Signed: Noe Gens, NP-C [facility] Pgr: [phone] Office: [phone]

## 2014-08-15 NOTE — Progress Notes (Signed)
Pt ready for discharge with vitals stable. I went over discharge summary and education with pt and husband. Both verbalized understanding. Pt taken down to vehicle by Nurse Tech. Lorin PicketLindsey Maxden Naji, RN

## 2014-08-15 NOTE — Progress Notes (Signed)
Vitals stable and pt ready for discharge. Waiting on home O2 to arrive. Lorin PicketLindsey Gerardo Caiazzo, RN

## 2014-08-15 NOTE — Progress Notes (Signed)
Physical Therapy Treatment Patient Details Name: Destiny Chapman MRN: [id number] DOB: [birth year] Today's Date: 08/15/2014    History of Present Illness 58 yowf smoker [facility] w/ working dx of sepsis and acute hypoxic respiratory failure in the setting of LLL CAP    PT Comments    Patient improving with ambulation, ambulated on 4 liters with saturations >93%, HR elevated to 137 with activity. Patient tolerated well despite fatigue.  Follow Up Recommendations  Home health PT;Supervision/Assistance - 24 hour (if going home on O2 (otherwise no PT follow up))     Equipment Recommendations  None recommended by PT    Recommendations for Other Services       Precautions / Restrictions Precautions Precautions: Fall (watch O2 saturations) Restrictions Weight Bearing Restrictions: No    Mobility  Bed Mobility Overal bed mobility: Modified Independent             General bed mobility comments: increased time to perform, no physical assist required  Transfers Overall transfer level: Needs assistance Equipment used: None Transfers: Sit to/from Stand Sit to Stand: Min guard         General transfer comment: VCs for upright posture, and hand placement to control descent to chair  Ambulation/Gait Ambulation/Gait assistance: Supervision Ambulation Distance (Feet): 120 Feet Assistive device: None Gait Pattern/deviations: Step-through pattern;Decreased stride length Gait velocity: decreased Gait velocity interpretation: Below normal speed for age/gender General Gait Details: VCs for increased cadence, elevated HR with ambulation 137 and O2 saturations remained >93% on 4 liters   Stairs            Wheelchair Mobility    Modified Rankin (Stroke Patients Only)       Balance                                    Cognition Arousal/Alertness: Awake/alert Behavior During Therapy: WFL for tasks assessed/performed Overall Cognitive  Status: Within Functional Limits for tasks assessed                      Exercises      General Comments        Pertinent Vitals/Pain      Home Living Family/patient expects to be discharged to:: Private residence Living Arrangements: Spouse/significant other Available Help at Discharge: Family Type of Home: House Home Access: Stairs to enter Entrance Stairs-Rails: None Home Layout: One level Home Equipment: Cane - single point;Wheelchair - manual      Prior Function Level of Independence: Independent with assistive device(s)          PT Goals (current goals can now be found in the care plan section) Acute Rehab PT Goals Patient Stated Goal: to go home PT Goal Formulation: With patient Time For Goal Achievement: 08/28/14 Potential to Achieve Goals: Good Progress towards PT goals: Progressing toward goals    Frequency  Min 3X/week    PT Plan Current plan remains appropriate    Co-evaluation             End of Session Equipment Utilized During Treatment: Gait belt;Oxygen Activity Tolerance: Patient tolerated treatment well;Patient limited by fatigue Patient left: in chair;with call bell/phone within reach     Time: 1022-1036 PT Time Calculation (min) (ACUTE ONLY): 14 min  Charges:  $Gait Training: 8-22 mins  G CodesFabio Asa 26-Aug-2014, 12:40 PM Charlotte Crumb, PT DPT  [phone]

## 2014-08-16 NOTE — Care Management Note (Signed)
    Page 1 of 1   08/16/2014     9:33:55 AM CARE MANAGEMENT NOTE 08/16/2014  Patient:  Destiny Chapman,Destiny Chapman   Account Number:  [id number]  Date Initiated:  08/09/2014  Documentation initiated by:  Avie ArenasBROWN,SARAH  Subjective/Objective Assessment:   Admitted with resp failure - on NRB.  Hypotensive - on pressors.     Anticipated DC Date:  08/14/2014   Anticipated DC Plan:  HOME/SELF CARE    DC Planning Services  CM consult      DME arranged  [facility]      [facility]       Status of service:  Completed, signed off  Discharge Disposition:  HOME/SELF CARE  Per UR Regulation:  Reviewed for med. necessity/level of care/duration of stay  Comments:  ContactCarmie End:  Vassey,Thomas Spouse [phone]  [phone]    Rochlus,Amanda Daughter [phone]  [phone]  08/15/14 1130 Joell Usman RN MSN BSN CCM Pt to d/c on O2 - she and husband plan to return home to [place] after d/c.  Currently has CPAP through a local supplier that is unable to provide tanks for d/c.  [facility] office [place] will arrange for [place] office to supply tanks and will set up home O2 for pt when she arrives home.

## 2016-05-06 IMAGING — CT CT ANGIO CHEST
2 of 6 series · 18 of 36 positions shown · IV contrast (APPLIED)
Comparison: None.

CLINICAL DATA: Patient passed out today. Syncopal episode today.
Short of breath. Hypotension. History of COPD.

EXAM:
CT ANGIOGRAPHY CHEST WITH CONTRAST
TECHNIQUE: Multidetector CT imaging of the chest was performed using the
standard protocol during bolus administration of intravenous
contrast. Multiplanar CT image reconstructions and MIPs were
obtained to evaluate the vascular anatomy.
CONTRAST:  100mL OMNIPAQUE IOHEXOL 350 MG/ML SOLN

[Series 5: pe 1.0 b26f · axial · 0.66mm/px · z∈[-288,-36]mm · 17 of 280 slices shown]
[im 14/280  lung]
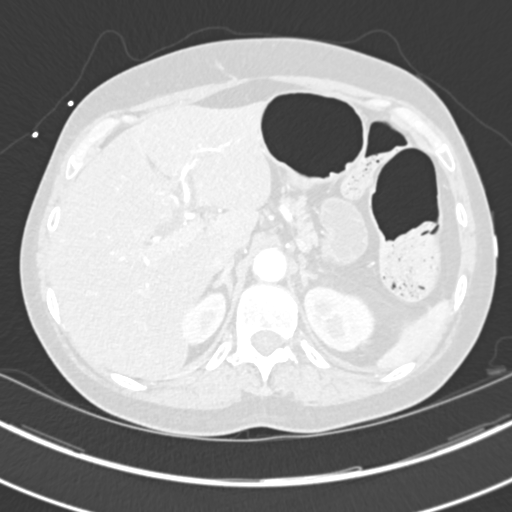
[im 28/280  mediastinal]
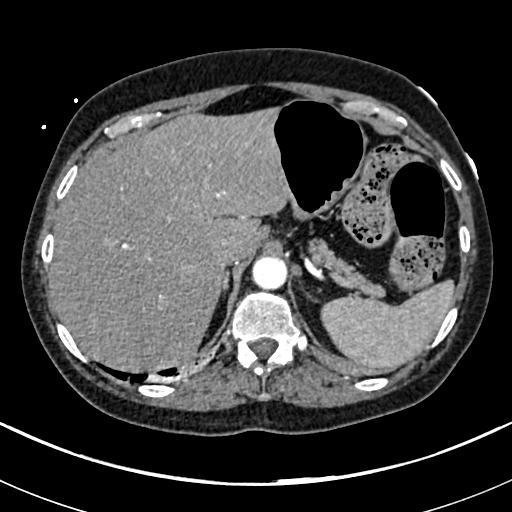
[im 42/280  lung]
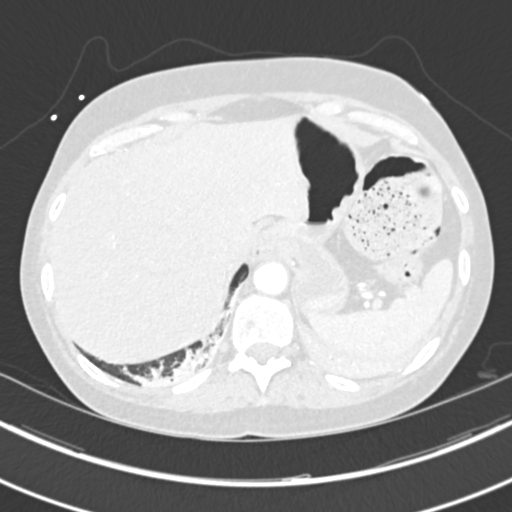
[im 56/280  mediastinal]
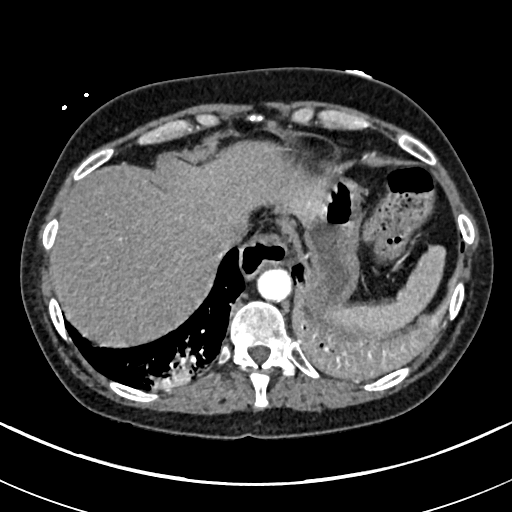
[im 84/280  lung]
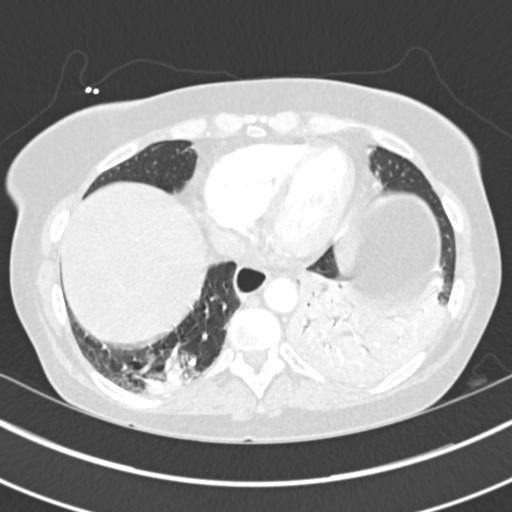
[im 98/280  mediastinal]
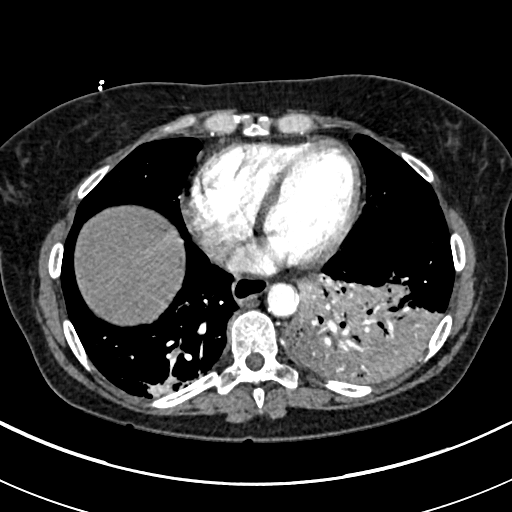
[im 112/280  lung]
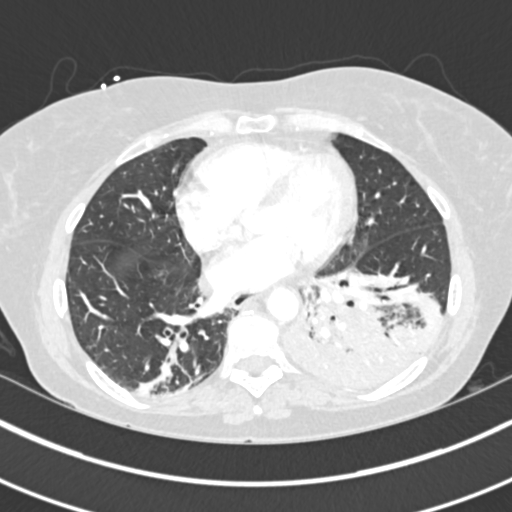
[im 126/280  mediastinal]
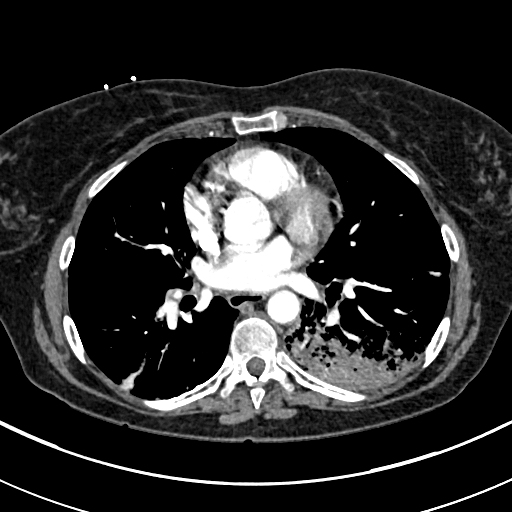
[im 140/280  lung]
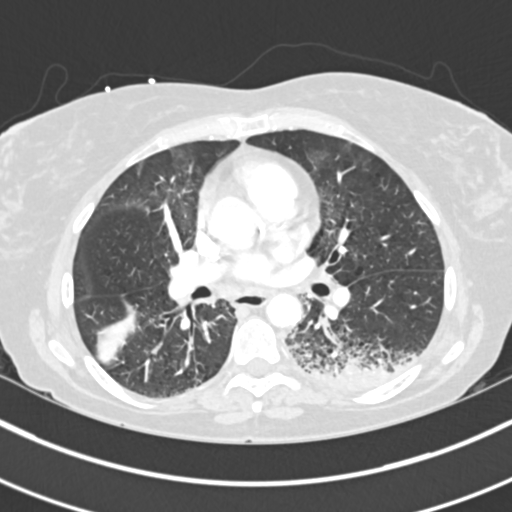
[im 154/280  mediastinal]
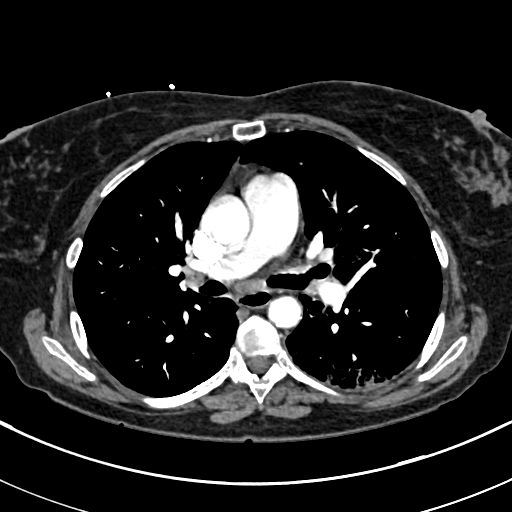
[im 168/280  lung]
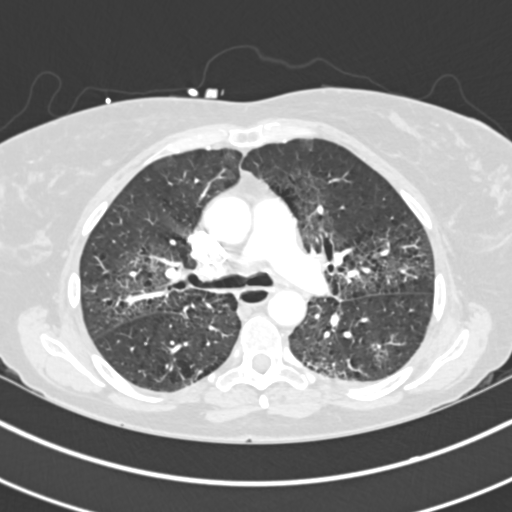
[im 182/280  mediastinal]
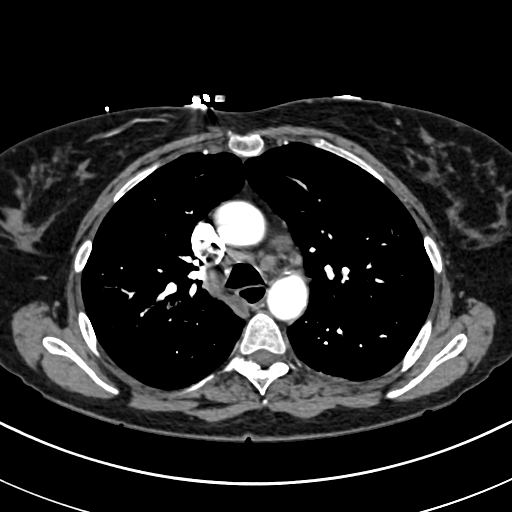
[im 196/280  lung]
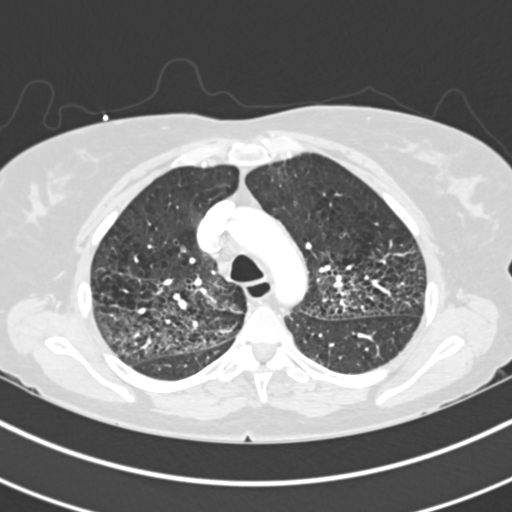
[im 224/280  mediastinal]
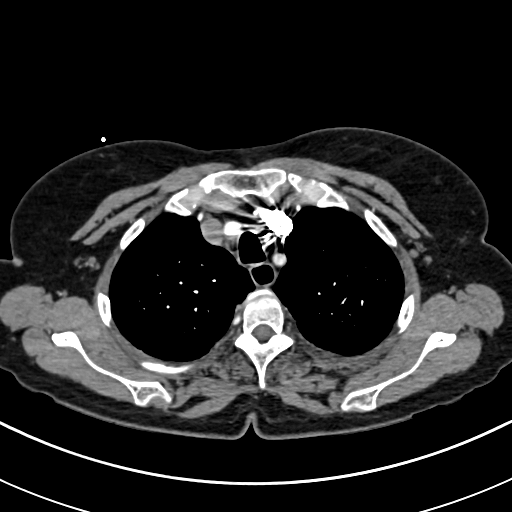
[im 238/280  lung]
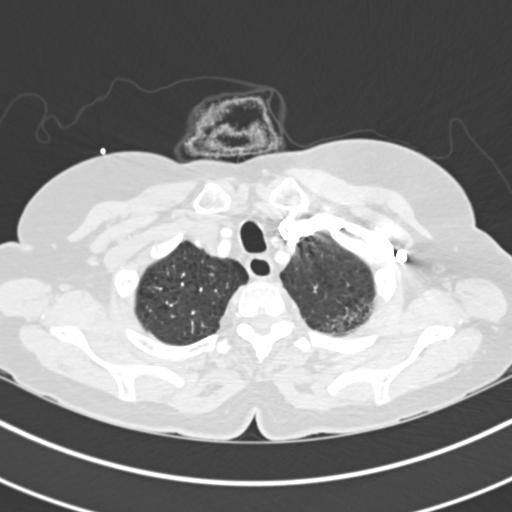
[im 252/280  mediastinal]
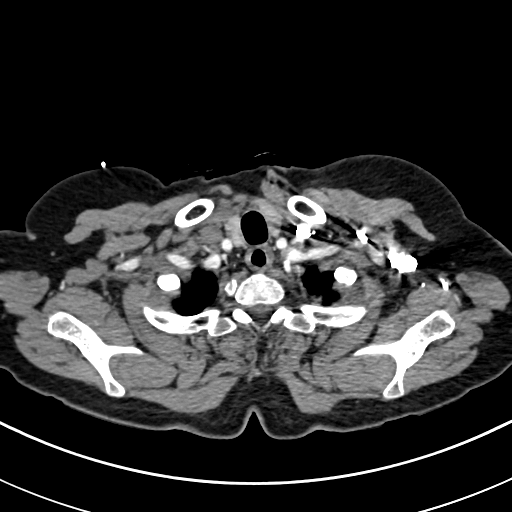
[im 266/280  lung]
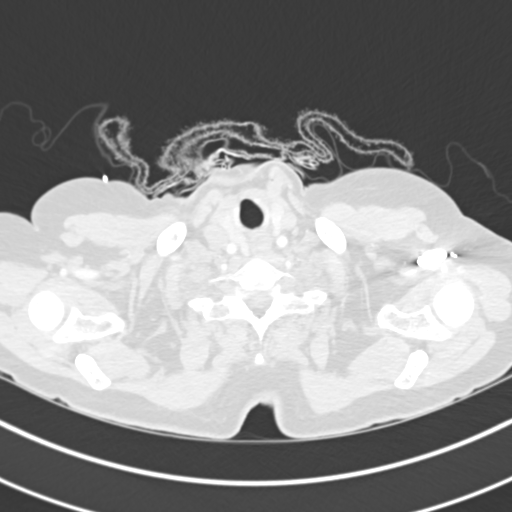

[Series 8: pe 2.0 coronal · coronal · 0.56mm/px · 1 of 126 slices shown]
[im 63/126  mediastinal]
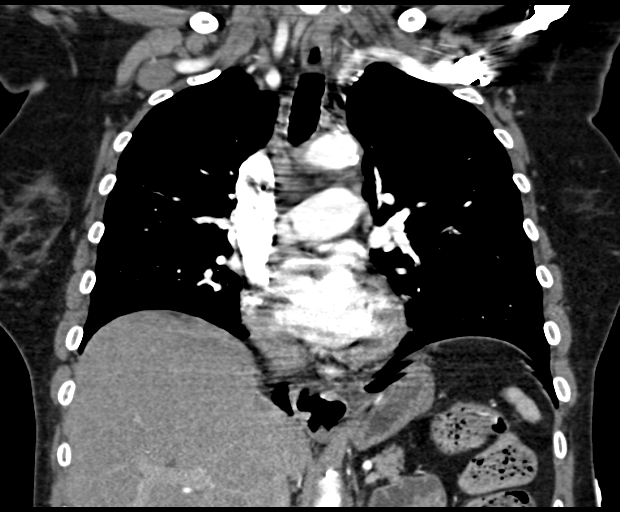

[18 of 36 positions shown; findings below may reference images not displayed]

FINDINGS: Bones: Thoracic vertebral body height is preserved. No aggressive
osseous lesions. No displaced rib fractures.

Cardiovascular: Technically adequate study for evaluation of
pulmonary embolus. Negative for pulmonary embolism. Coronary artery
atherosclerosis is present. If office based assessment of coronary
risk factors has not been performed, it is now recommended. Motion
artifact is present, mildly degrading the exam. Aortic arch
atherosclerosis.

Lungs: There is consolidation of most of the LEFT lower lobe with a
dependent distribution. A small area of consolidation in the RIGHT
lower lobe is also present. There is underlying emphysema. Upper
lobe faint interstitial and airspace opacity.

Central airways: Patent.  No bronchial air-fluid levels.

Effusions: None.

Lymphadenopathy: No axillary adenopathy. Small peritracheal an AP
window lymph nodes are probably reactive.

Esophagus: Patulous.  Small hiatal hernia.

Upper abdomen: Within normal limits.

Other: None.

Review of the MIP images confirms the above findings.
IMPRESSION: 1. Negative for pulmonary embolism or acute aortic abnormality.
2. Atherosclerosis and coronary artery disease.
3. Consolidation of most of the LEFT lower lobe with smaller area of
consolidation in the dependent RIGHT lower lobe. The pattern is most
compatible with aspiration.
4. Emphysema, with interstitial and airspace opacity in the upper
lobes. This may represent aspiration or superimposed pulmonary
edema.
